# Patient Record
Sex: Male | Born: 1965
Health system: Southern US, Community
[De-identification: ages and names within clinical notes are randomized; demographics above are authoritative.]

## PROBLEM LIST (undated history)

## (undated) DIAGNOSIS — I10 Essential (primary) hypertension: Secondary | ICD-10-CM

## (undated) DIAGNOSIS — C801 Malignant (primary) neoplasm, unspecified: Secondary | ICD-10-CM

## (undated) DIAGNOSIS — T7840XA Allergy, unspecified, initial encounter: Secondary | ICD-10-CM

## (undated) DIAGNOSIS — M7500 Adhesive capsulitis of unspecified shoulder: Secondary | ICD-10-CM

## (undated) DIAGNOSIS — E119 Type 2 diabetes mellitus without complications: Secondary | ICD-10-CM

## (undated) HISTORY — DX: Malignant (primary) neoplasm, unspecified: C80.1

## (undated) HISTORY — DX: Type 2 diabetes mellitus without complications: E11.9

## (undated) HISTORY — DX: Essential (primary) hypertension: I10

## (undated) HISTORY — DX: Allergy, unspecified, initial encounter: T78.40XA

## (undated) HISTORY — DX: Adhesive capsulitis of unspecified shoulder: M75.00

---

## 2011-05-07 DIAGNOSIS — G4700 Insomnia, unspecified: Secondary | ICD-10-CM | POA: Insufficient documentation

## 2011-05-07 DIAGNOSIS — Z720 Tobacco use: Secondary | ICD-10-CM | POA: Insufficient documentation

## 2011-05-07 DIAGNOSIS — E119 Type 2 diabetes mellitus without complications: Secondary | ICD-10-CM | POA: Insufficient documentation

## 2011-08-06 DIAGNOSIS — E785 Hyperlipidemia, unspecified: Secondary | ICD-10-CM | POA: Insufficient documentation

## 2011-10-08 DIAGNOSIS — N529 Male erectile dysfunction, unspecified: Secondary | ICD-10-CM | POA: Insufficient documentation

## 2011-12-09 DIAGNOSIS — C4491 Basal cell carcinoma of skin, unspecified: Secondary | ICD-10-CM | POA: Insufficient documentation

## 2012-01-20 DIAGNOSIS — Z483 Aftercare following surgery for neoplasm: Secondary | ICD-10-CM | POA: Insufficient documentation

## 2014-02-10 DIAGNOSIS — S43439A Superior glenoid labrum lesion of unspecified shoulder, initial encounter: Secondary | ICD-10-CM | POA: Insufficient documentation

## 2014-03-16 DIAGNOSIS — M5136 Other intervertebral disc degeneration, lumbar region: Secondary | ICD-10-CM | POA: Insufficient documentation

## 2014-03-16 DIAGNOSIS — M545 Low back pain, unspecified: Secondary | ICD-10-CM | POA: Insufficient documentation

## 2014-03-16 DIAGNOSIS — M5416 Radiculopathy, lumbar region: Secondary | ICD-10-CM | POA: Insufficient documentation

## 2014-03-16 DIAGNOSIS — M51369 Other intervertebral disc degeneration, lumbar region without mention of lumbar back pain or lower extremity pain: Secondary | ICD-10-CM | POA: Insufficient documentation

## 2017-06-26 DIAGNOSIS — M7502 Adhesive capsulitis of left shoulder: Secondary | ICD-10-CM | POA: Insufficient documentation

## 2018-05-18 ENCOUNTER — Ambulatory Visit (INDEPENDENT_AMBULATORY_CARE_PROVIDER_SITE_OTHER): Payer: BLUE CROSS/BLUE SHIELD | Admitting: Family Medicine

## 2018-05-18 ENCOUNTER — Other Ambulatory Visit: Payer: Self-pay

## 2018-05-18 ENCOUNTER — Encounter: Payer: Self-pay | Admitting: Family Medicine

## 2018-05-18 VITALS — BP 102/60 | HR 84 | Ht 72.0 in | Wt 206.0 lb

## 2018-05-18 DIAGNOSIS — E119 Type 2 diabetes mellitus without complications: Secondary | ICD-10-CM

## 2018-05-18 DIAGNOSIS — Z7689 Persons encountering health services in other specified circumstances: Secondary | ICD-10-CM | POA: Diagnosis not present

## 2018-05-18 DIAGNOSIS — F17210 Nicotine dependence, cigarettes, uncomplicated: Secondary | ICD-10-CM

## 2018-05-18 DIAGNOSIS — D229 Melanocytic nevi, unspecified: Secondary | ICD-10-CM | POA: Diagnosis not present

## 2018-05-18 DIAGNOSIS — I10 Essential (primary) hypertension: Secondary | ICD-10-CM

## 2018-05-18 DIAGNOSIS — E785 Hyperlipidemia, unspecified: Secondary | ICD-10-CM | POA: Diagnosis not present

## 2018-05-18 MED ORDER — METFORMIN HCL 1000 MG PO TABS: 1000.0000 mg | ORAL_TABLET | Freq: Two times a day (BID) | ORAL | 1 refills | Status: AC

## 2018-05-18 MED ORDER — LISINOPRIL 10 MG PO TABS: 10.0000 mg | ORAL_TABLET | Freq: Every day | ORAL | 1 refills | Status: DC

## 2018-05-18 MED ORDER — GLIMEPIRIDE 1 MG PO TABS: 1.0000 mg | ORAL_TABLET | Freq: Every day | ORAL | 1 refills | Status: DC

## 2018-05-18 NOTE — Patient Instructions (Signed)
Coping with Quitting Smoking  Quitting smoking is a physical and mental challenge. You will face cravings, withdrawal symptoms, and temptation. Before quitting, work with your health care provider to make a plan that can help you cope. Preparation can help you quit and keep you from giving in. How can I cope with cravings? Cravings usually last for 5-10 minutes. If you get through it, the craving will pass. Consider taking the following actions to help you cope with cravings:  Keep your mouth busy: ? Chew sugar-free gum. ? Suck on hard candies or a straw. ? Brush your teeth.  Keep your hands and body busy: ? Immediately change to a different activity when you feel a craving. ? Squeeze or play with a ball. ? Do an activity or a hobby, like making bead jewelry, practicing needlepoint, or working with wood. ? Mix up your normal routine. ? Take a short exercise break. Go for a quick walk or run up and down stairs. ? Spend time in public places where smoking is not allowed.  Focus on doing something kind or helpful for someone else.  Call a friend or family member to talk during a craving.  Join a support group.  Call a quit line, such as 1-800-QUIT-NOW.  Talk with your health care provider about medicines that might help you cope with cravings and make quitting easier for you. How can I deal with withdrawal symptoms? Your body may experience negative effects as it tries to get used to not having nicotine in the system. These effects are called withdrawal symptoms. They may include:  Feeling hungrier than normal.  Trouble concentrating.  Irritability.  Trouble sleeping.  Feeling depressed.  Restlessness and agitation.  Craving a cigarette. To manage withdrawal symptoms:  Avoid places, people, and activities that trigger your cravings.  Remember why you want to quit.  Get plenty of sleep.  Avoid coffee and other caffeinated drinks. These may worsen some of your symptoms.  How can I handle social situations? Social situations can be difficult when you are quitting smoking, especially in the first few weeks. To manage this, you can:  Avoid parties, bars, and other social situations where people might be smoking.  Avoid alcohol.  Leave right away if you have the urge to smoke.  Explain to your family and friends that you are quitting smoking. Ask for understanding and support.  Plan activities with friends or family where smoking is not an option. What are some ways I can cope with stress? Wanting to smoke may cause stress, and stress can make you want to smoke. Find ways to manage your stress. Relaxation techniques can help. For example:  Breathe slowly and deeply, in through your nose and out through your mouth.  Listen to soothing, relaxing music.  Talk with a family member or friend about your stress.  Light a candle.  Soak in a bath or take a shower.  Think about a peaceful place. What are some ways I can prevent weight gain? Be aware that many people gain weight after they quit smoking. However, not everyone does. To keep from gaining weight, have a plan in place before you quit and stick to the plan after you quit. Your plan should include:  Having healthy snacks. When you have a craving, it may help to: ? Eat plain popcorn, crunchy carrots, celery, or other cut vegetables. ? Chew sugar-free gum.  Changing how you eat: ? Eat small portion sizes at meals. ? Eat 4-6 small meals   throughout the day instead of 1-2 large meals a day. ? Be mindful when you eat. Do not watch television or do other things that might distract you as you eat.  Exercising regularly: ? Make time to exercise each day. If you do not have time for a long workout, do short bouts of exercise for 5-10 minutes several times a day. ? Do some form of strengthening exercise, like weight lifting, and some form of aerobic exercise, like running or swimming.  Drinking plenty of  water or other low-calorie or no-calorie drinks. Drink 6-8 glasses of water daily, or as much as instructed by your health care provider. Summary  Quitting smoking is a physical and mental challenge. You will face cravings, withdrawal symptoms, and temptation to smoke again. Preparation can help you as you go through these challenges.  You can cope with cravings by keeping your mouth busy (such as by chewing gum), keeping your body and hands busy, and making calls to family, friends, or a helpline for people who want to quit smoking.  You can cope with withdrawal symptoms by avoiding places where people smoke, avoiding drinks with caffeine, and getting plenty of rest.  Ask your health care provider about the different ways to prevent weight gain, avoid stress, and handle social situations. This information is not intended to replace advice given to you by your health care provider. Make sure you discuss any questions you have with your health care provider. Document Released: 02/02/2016 Document Revised: 02/02/2016 Document Reviewed: 02/02/2016 Elsevier Interactive Patient Education  2019 [employer]  

## 2018-05-18 NOTE — Progress Notes (Signed)
Date:  05/18/2018   Name:  Jacob Coleman   DOB:  12-30-1965   MRN:  [id number]   Chief Complaint: Establish Care; Diabetes (needs A1C and micro); and pneum 23  Patient is a 53 year old male who presents for an establish care exam. The patient reports the following problems: diabetes,htn,dyslipidemia. Health maintenance has been reviewed pneumococcal 23.  Diabetes  He presents for his follow-up diabetic visit. He has type 2 diabetes mellitus. His disease course has been stable. There are no hypoglycemic associated symptoms. Pertinent negatives for hypoglycemia include no dizziness, headaches, nervousness/anxiousness or sweats. Associated symptoms include weight loss. Pertinent negatives for diabetes include no blurred vision, no chest pain, no fatigue, no foot paresthesias, no foot ulcerations, no polydipsia, no polyphagia, no polyuria, no visual change and no weakness. There are no hypoglycemic complications. Symptoms are stable. There are no diabetic complications. Pertinent negatives for diabetic complications include no autonomic neuropathy, CVA, heart disease, peripheral neuropathy, PVD or retinopathy. Risk factors for coronary artery disease include diabetes mellitus, dyslipidemia, male sex, hypertension and tobacco exposure. Current diabetic treatment includes oral agent (dual therapy). He is compliant with treatment all of the time. His weight is decreasing steadily. He is following a generally unhealthy diet. Meal planning includes avoidance of concentrated sweets and carbohydrate counting. He participates in exercise daily. His breakfast blood glucose is taken between 7-8 am. His breakfast blood glucose range is generally 110-130 mg/dl. An ACE inhibitor/angiotensin II receptor blocker is being taken. He does not see a podiatrist.Eye exam is current.  Hyperlipidemia  This is a chronic problem. The current episode started more than 1 year ago. The problem is controlled. Recent lipid tests  were reviewed and are normal. He has no history of chronic renal disease, diabetes, hypothyroidism, liver disease, obesity or nephrotic syndrome. There are no known factors aggravating his hyperlipidemia. Pertinent negatives include no chest pain, focal sensory loss, focal weakness, leg pain, myalgias or shortness of breath. He is currently on no antihyperlipidemic treatment. The current treatment provides no improvement of lipids. There are no compliance problems.  Risk factors for coronary artery disease include dyslipidemia, diabetes mellitus, hypertension and male sex.  Hypertension  This is a chronic problem. The current episode started more than 1 year ago. The problem has been waxing and waning since onset. Pertinent negatives include no anxiety, blurred vision, chest pain, headaches, malaise/fatigue, neck pain, orthopnea, palpitations, peripheral edema, PND, shortness of breath or sweats. There are no associated agents to hypertension. There are no known risk factors for coronary artery disease. Past treatments include ACE inhibitors. The current treatment provides moderate improvement. There are no compliance problems.  There is no history of angina, CAD/MI, CVA, heart failure, left ventricular hypertrophy, PVD or retinopathy. There is no history of chronic renal disease, a hypertension causing med or renovascular disease.  Shoulder Pain   The pain is present in the left shoulder. This is a recurrent problem. The current episode started more than 1 month ago. The problem occurs daily. The problem has been gradually improving. The pain is moderate. Pertinent negatives include no fever. He has tried nothing (90% improved) for the symptoms. There is no history of diabetes.    Review of Systems  Constitutional: Positive for weight loss. Negative for chills, fatigue, fever and malaise/fatigue.  HENT: Negative for drooling, ear discharge, ear pain and sore throat.   Eyes: Negative for blurred vision.   Respiratory: Negative for cough, shortness of breath and wheezing.   Cardiovascular:  Negative for chest pain, palpitations, orthopnea, leg swelling and PND.  Gastrointestinal: Negative for abdominal pain, blood in stool, constipation, diarrhea and nausea.  Endocrine: Negative for polydipsia, polyphagia and polyuria.  Genitourinary: Negative for dysuria, frequency, hematuria and urgency.  Musculoskeletal: Negative for back pain, myalgias and neck pain.  Skin: Negative for rash.  Allergic/Immunologic: Negative for environmental allergies.  Neurological: Negative for dizziness, focal weakness, weakness and headaches.  Hematological: Does not bruise/bleed easily.  Psychiatric/Behavioral: Negative for suicidal ideas. The patient is not nervous/anxious.     Patient Active Problem List   Diagnosis Date Noted  . Adhesive capsulitis of left shoulder 06/26/2017  . DDD (degenerative disc disease), lumbar 03/16/2014  . Low back pain 03/16/2014  . Lumbar radicular pain 03/16/2014  . Superior glenoid labrum lesion 02/10/2014  . Aftercare following surgery for neoplasm 01/20/2012  . Basal cell carcinoma 12/09/2011  . ED (erectile dysfunction) 10/08/2011  . Dyslipidemia 08/06/2011  . Diabetes mellitus without complication ([place]) 41/74/0814  . Insomnia 05/07/2011  . Tobacco use 05/07/2011    No Known Allergies  History reviewed. No pertinent surgical history.  Social History   Tobacco Use  . Smoking status: Current Every Day Smoker    Types: Cigarettes, E-cigarettes  . Smokeless tobacco: Never Used  . Tobacco comment: wants info on quiting  Substance Use Topics  . Alcohol use: Yes  . Drug use: Never     Medication list has been reviewed and updated.  Current Meds  Medication Sig  . aspirin EC 81 MG tablet Take 1 tablet by mouth daily.  Marland Kitchen lisinopril (PRINIVIL,ZESTRIL) 10 MG tablet Take 10 mg by mouth daily.  . metFORMIN (GLUCOPHAGE) 1000 MG tablet Take 1 tablet by mouth 2 (two)  times daily.    PHQ 2/9 Scores 05/18/2018  PHQ - 2 Score 0  PHQ- 9 Score 0    BP Readings from Last 3 Encounters:  05/18/18 102/60    Physical Exam Vitals signs and nursing note reviewed.  Constitutional:      Appearance: He is normal weight.  HENT:     Head: Normocephalic.     Right Ear: External ear normal.     Left Ear: External ear normal.     Nose: Nose normal.  Eyes:     General: No scleral icterus.       Right eye: No discharge.        Left eye: No discharge.     Conjunctiva/sclera: Conjunctivae normal.     Pupils: Pupils are equal, round, and reactive to light.  Neck:     Musculoskeletal: Normal range of motion and neck supple.     Thyroid: No thyromegaly.     Vascular: No JVD.     Trachea: No tracheal deviation.  Cardiovascular:     Rate and Rhythm: Normal rate and regular rhythm.     Heart sounds: Normal heart sounds. No murmur. No friction rub. No gallop.   Pulmonary:     Effort: No respiratory distress.     Breath sounds: Normal breath sounds. No wheezing or rales.  Abdominal:     General: Bowel sounds are normal.     Palpations: Abdomen is soft. There is no mass.     Tenderness: There is no abdominal tenderness. There is no guarding or rebound.  Musculoskeletal: Normal range of motion.        General: No tenderness.  Lymphadenopathy:     Cervical: No cervical adenopathy.  Skin:    General: Skin is warm.  Findings: No rash.     Comments: Multiple nevi  Neurological:     Mental Status: He is alert and oriented to person, place, and time.     Cranial Nerves: No cranial nerve deficit.     Deep Tendon Reflexes: Reflexes are normal and symmetric.     Wt Readings from Last 3 Encounters:  05/18/18 206 lb (93.4 kg)    BP 102/60   Pulse 84   Ht 6' (1.829 m)   Wt 206 lb (93.4 kg)   BMI 27.94 kg/m   Assessment and Plan: 1. Establishing care with new doctor, encounter for Patient establishes care with new physician.  Patient's chart was reviewed  for previous visits, labs, imaging, and specialty notes.  2. Type 2 diabetes mellitus without complication, without long-term current use of insulin ([place]) Patient has longstanding diabetes for which he has had intermittent controlled.  Presently he is not taking neither his Amaryl or metformin on a regular basis.  Will resume both of these on previous dosing schedule and will check an A1c at this time to see the range in which he is - glimepiride (AMARYL) 1 MG tablet; Take 1 tablet (1 mg total) by mouth daily.  Dispense: 90 tablet; Refill: 1 - metFORMIN (GLUCOPHAGE) 1000 MG tablet; Take 1 tablet (1,000 mg total) by mouth 2 (two) times daily.  Dispense: 180 tablet; Refill: 1 - Hemoglobin A1c  3. Essential hypertension Patient has a history of hypertension.  We will continue lisinopril 10 mg once a day and check a renal function panel - lisinopril (PRINIVIL,ZESTRIL) 10 MG tablet; Take 1 tablet (10 mg total) by mouth daily.  Dispense: 90 tablet; Refill: 1 - Renal Function Panel  4. Multiple pigmented nevi Patient has had dermatology mole checks on a regular basis until most recently.  Patient will be referred to Dr. Phillip Heal dermatology for evaluation of multiple pigmented nevi - Ambulatory referral to Dermatology  5. Cigarette nicotine dependence without complication Patient has a history of nicotine dependence in the past on vaping/smoking.  Information was given on cessation and will discuss location if needed in next visit  6. Dyslipidemia Patient has history of dyslipidemia.  Will check a lipid panel and adjust accordingly. - Lipid panel

## 2018-05-19 LAB — RENAL FUNCTION PANEL
Albumin: 4.8 g/dL (ref 3.8–4.9)
BUN/Creatinine Ratio: 20 (ref 9–20)
BUN: 16 mg/dL (ref 6–24)
CO2: 21 mmol/L (ref 20–29)
Calcium: 10 mg/dL (ref 8.7–10.2)
Chloride: 98 mmol/L (ref 96–106)
Creatinine, Ser: 0.82 mg/dL (ref 0.76–1.27)
GFR calc Af Amer: 117 mL/min/{1.73_m2} (ref >59)
GFR calc non Af Amer: 101 mL/min/{1.73_m2} (ref >59)
Glucose: 295 mg/dL — ABNORMAL HIGH (ref 65–99)
Phosphorus: 3.2 mg/dL (ref 2.8–4.1)
Potassium: 4.8 mmol/L (ref 3.5–5.2)
Sodium: 137 mmol/L (ref 134–144)

## 2018-05-19 LAB — LIPID PANEL
Chol/HDL Ratio: 5.8 ratio — ABNORMAL HIGH (ref 0.0–5.0)
Cholesterol, Total: 233 mg/dL — ABNORMAL HIGH (ref 100–199)
HDL: 40 mg/dL (ref >39)
LDL Calculated: 161 mg/dL — ABNORMAL HIGH (ref 0–99)
Triglycerides: 158 mg/dL — ABNORMAL HIGH (ref 0–149)
VLDL Cholesterol Cal: 32 mg/dL (ref 5–40)

## 2018-05-19 LAB — HEMOGLOBIN A1C
Est. average glucose Bld gHb Est-mCnc: 246 mg/dL
Hgb A1c MFr Bld: 10.2 % — ABNORMAL HIGH (ref 4.8–5.6)

## 2018-05-25 DIAGNOSIS — Z85828 Personal history of other malignant neoplasm of skin: Secondary | ICD-10-CM | POA: Diagnosis not present

## 2018-05-25 DIAGNOSIS — L570 Actinic keratosis: Secondary | ICD-10-CM | POA: Diagnosis not present

## 2018-05-25 DIAGNOSIS — L578 Other skin changes due to chronic exposure to nonionizing radiation: Secondary | ICD-10-CM | POA: Diagnosis not present

## 2018-05-25 DIAGNOSIS — D485 Neoplasm of uncertain behavior of skin: Secondary | ICD-10-CM | POA: Diagnosis not present

## 2018-06-29 ENCOUNTER — Other Ambulatory Visit: Payer: Self-pay

## 2018-06-29 ENCOUNTER — Ambulatory Visit (INDEPENDENT_AMBULATORY_CARE_PROVIDER_SITE_OTHER): Payer: BLUE CROSS/BLUE SHIELD | Admitting: Family Medicine

## 2018-06-29 ENCOUNTER — Encounter: Payer: Self-pay | Admitting: Family Medicine

## 2018-06-29 VITALS — BP 130/62 | HR 72 | Ht 72.0 in | Wt 212.0 lb

## 2018-06-29 DIAGNOSIS — Z23 Encounter for immunization: Secondary | ICD-10-CM

## 2018-06-29 DIAGNOSIS — E119 Type 2 diabetes mellitus without complications: Secondary | ICD-10-CM

## 2018-06-29 DIAGNOSIS — I10 Essential (primary) hypertension: Secondary | ICD-10-CM | POA: Diagnosis not present

## 2018-06-29 DIAGNOSIS — E785 Hyperlipidemia, unspecified: Secondary | ICD-10-CM | POA: Diagnosis not present

## 2018-06-29 DIAGNOSIS — E663 Overweight: Secondary | ICD-10-CM | POA: Diagnosis not present

## 2018-06-29 MED ORDER — ATORVASTATIN CALCIUM 10 MG PO TABS: 10.0000 mg | ORAL_TABLET | Freq: Every day | ORAL | 1 refills | Status: DC

## 2018-06-29 MED ORDER — GLIMEPIRIDE 2 MG PO TABS: 2.0000 mg | ORAL_TABLET | Freq: Every day | ORAL | 1 refills | Status: AC

## 2018-06-29 NOTE — Progress Notes (Signed)
Date:  06/29/2018   Name:  Jacob Coleman   DOB:  11/01/1965   MRN:  [id number]   Chief Complaint: Diabetes (has been back on Amaryl and metformin- last A1c 10.2/ need foot exam) and pneum vacc (needs 23 vaccine)  Diabetes  He presents for his follow-up diabetic visit. He has type 2 diabetes mellitus. His disease course has been stable. There are no hypoglycemic associated symptoms. Pertinent negatives for hypoglycemia include no dizziness, headaches or nervousness/anxiousness. Pertinent negatives for diabetes include no blurred vision, no chest pain, no fatigue, no foot paresthesias, no foot ulcerations, no polydipsia, no polyphagia, no polyuria, no visual change, no weakness and no weight loss. There are no hypoglycemic complications. Symptoms are stable. There are no diabetic complications. Risk factors for coronary artery disease include diabetes mellitus, dyslipidemia, hypertension and male sex. He is compliant with treatment most of the time. His weight is stable. He is following a generally healthy diet. Meal planning includes avoidance of concentrated sweets. He participates in exercise intermittently. There is no change (200 range) in his home blood glucose trend. His breakfast blood glucose is taken between 8-9 am. His breakfast blood glucose range is generally 180-200 mg/dl. An ACE inhibitor/angiotensin II receptor blocker is being taken. He does not see a podiatrist.Eye exam is current.  Hyperlipidemia  This is a chronic problem. The current episode started more than 1 year ago. The problem is controlled. Recent lipid tests were reviewed and are normal. There are no known factors aggravating his hyperlipidemia. Pertinent negatives include no chest pain, focal sensory loss, focal weakness, leg pain, myalgias or shortness of breath. Current antihyperlipidemic treatment includes diet change. The current treatment provides moderate improvement of lipids. Compliance problems include adherence  to diet.  Risk factors for coronary artery disease include dyslipidemia, diabetes mellitus, hypertension and male sex.  Hypertension  This is a chronic problem. The current episode started more than 1 year ago. The problem is unchanged. Pertinent negatives include no blurred vision, chest pain, headaches, neck pain, orthopnea, palpitations, PND or shortness of breath. Risk factors for coronary artery disease include dyslipidemia. Past treatments include ACE inhibitors. The current treatment provides moderate improvement. There are no compliance problems.     Review of Systems  Constitutional: Negative for chills, fatigue, fever and weight loss.  HENT: Negative for drooling, ear discharge, ear pain and sore throat.   Eyes: Negative for blurred vision.  Respiratory: Negative for cough, shortness of breath and wheezing.   Cardiovascular: Negative for chest pain, palpitations, orthopnea, leg swelling and PND.  Gastrointestinal: Negative for abdominal pain, blood in stool, constipation, diarrhea and nausea.  Endocrine: Negative for polydipsia, polyphagia and polyuria.  Genitourinary: Negative for dysuria, frequency, hematuria and urgency.  Musculoskeletal: Negative for back pain, myalgias and neck pain.  Skin: Negative for rash.  Allergic/Immunologic: Negative for environmental allergies.  Neurological: Negative for dizziness, focal weakness, weakness and headaches.  Hematological: Does not bruise/bleed easily.  Psychiatric/Behavioral: Negative for suicidal ideas. The patient is not nervous/anxious.     Patient Active Problem List   Diagnosis Date Noted  . Adhesive capsulitis of left shoulder 06/26/2017  . DDD (degenerative disc disease), lumbar 03/16/2014  . Low back pain 03/16/2014  . Lumbar radicular pain 03/16/2014  . Superior glenoid labrum lesion 02/10/2014  . Aftercare following surgery for neoplasm 01/20/2012  . Basal cell carcinoma 12/09/2011  . ED (erectile dysfunction) 10/08/2011   . Dyslipidemia 08/06/2011  . Diabetes mellitus without complication ([place]) 02/72/5366  . Insomnia  05/07/2011  . Tobacco use 05/07/2011    No Known Allergies  No past surgical history on file.  Social History   Tobacco Use  . Smoking status: Current Every Day Smoker    Types: Cigarettes, E-cigarettes  . Smokeless tobacco: Never Used  . Tobacco comment: wants info on quiting  Substance Use Topics  . Alcohol use: Yes  . Drug use: Never     Medication list has been reviewed and updated.  Current Meds  Medication Sig  . aspirin EC 81 MG tablet Take 1 tablet by mouth daily.  Marland Kitchen glimepiride (AMARYL) 1 MG tablet Take 1 tablet (1 mg total) by mouth daily.  Marland Kitchen lisinopril (PRINIVIL,ZESTRIL) 10 MG tablet Take 1 tablet (10 mg total) by mouth daily.  . metFORMIN (GLUCOPHAGE) 1000 MG tablet Take 1 tablet (1,000 mg total) by mouth 2 (two) times daily.    PHQ 2/9 Scores 05/18/2018  PHQ - 2 Score 0  PHQ- 9 Score 0    BP Readings from Last 3 Encounters:  06/29/18 130/62  05/18/18 102/60    Physical Exam Vitals signs and nursing note reviewed.  HENT:     Head: Normocephalic.     Right Ear: External ear normal.     Left Ear: External ear normal.     Nose: Nose normal.  Eyes:     General: No scleral icterus.       Right eye: No discharge.        Left eye: No discharge.     Conjunctiva/sclera: Conjunctivae normal.     Pupils: Pupils are equal, round, and reactive to light.  Neck:     Musculoskeletal: Normal range of motion and neck supple.     Thyroid: No thyromegaly.     Vascular: No JVD.     Trachea: No tracheal deviation.  Cardiovascular:     Rate and Rhythm: Normal rate and regular rhythm.     Pulses:          Carotid pulses are 2+ on the right side and 2+ on the left side.      Radial pulses are 2+ on the right side and 2+ on the left side.       Dorsalis pedis pulses are 2+ on the right side and 2+ on the left side.       Posterior tibial pulses are 2+ on the right  side and 2+ on the left side.     Heart sounds: Normal heart sounds. No murmur. No friction rub. No gallop.   Pulmonary:     Effort: No respiratory distress.     Breath sounds: Normal breath sounds. No wheezing or rales.  Abdominal:     General: Bowel sounds are normal.     Palpations: Abdomen is soft. There is no mass.     Tenderness: There is no abdominal tenderness. There is no guarding or rebound.  Musculoskeletal: Normal range of motion.        General: No tenderness.     Right lower leg: No edema.     Left lower leg: No edema.  Feet:     Right foot:     Protective Sensation: 10 sites tested. 10 sites sensed.     Skin integrity: Skin integrity normal.     Left foot:     Protective Sensation: 10 sites tested. 10 sites sensed.     Skin integrity: Skin integrity normal.  Lymphadenopathy:     Cervical: No cervical adenopathy.  Skin:  General: Skin is warm.     Findings: No rash.  Neurological:     Mental Status: He is alert and oriented to person, place, and time.     Cranial Nerves: No cranial nerve deficit.     Deep Tendon Reflexes: Reflexes are normal and symmetric.     Wt Readings from Last 3 Encounters:  06/29/18 212 lb (96.2 kg)  05/18/18 206 lb (93.4 kg)    BP 130/62   Pulse 72   Ht 6' (1.829 m)   Wt 212 lb (96.2 kg)   BMI 28.75 kg/m   Assessment and Plan: 1. Diabetes mellitus without complication ([facility]) Chronic.  Uncontrolled.  Patient's had elevated readings on Amaryl 1 mg.  Will check an A1c/glucose and will probably need to go to glimepiride 2 mg 1 a day to be rechecked in 6 weeks this may change to 1 mg pending A1c reading. - Hemoglobin A1c - Glucose - glimepiride (AMARYL) 2 MG tablet; Take 1 tablet (2 mg total) by mouth daily before breakfast.  Dispense: 60 tablet; Refill: 1  2. Dyslipidemia Chronic.  Unknown.  Previous readings have been elevated for LDL as well as VLDL triglycerides and HDL has been decreased.  I am anticipating having to initiate  a statin and we will start with atorvastatin 10 mg patient was given a written prescription to hold until I return a call with a LDL reading from today that is fasting. - Direct LDL - atorvastatin (LIPITOR) 10 MG tablet; Take 1 tablet (10 mg total) by mouth daily.  Dispense: 90 tablet; Refill: 1  3. Essential hypertension Patient has hypertension which is controlled.  And chronic in nature.  We will continue on lisinopril 10 mg once a day.  4. Overweight (BMI 25.0-29.9) Health risks of being over weight were discussed and patient was counseled on weight loss options and exercise.  Low-cholesterol sheets were given as guidelines for weight loss as well  5. Need for Tdap vaccination Discussed and administered. - Tdap vaccine greater than or equal to 7yo IM

## 2018-06-29 NOTE — Patient Instructions (Signed)

## 2018-06-30 LAB — GLUCOSE, RANDOM: Glucose: 192 mg/dL — ABNORMAL HIGH (ref 65–99)

## 2018-06-30 LAB — HEMOGLOBIN A1C
Est. average glucose Bld gHb Est-mCnc: 217 mg/dL
Hgb A1c MFr Bld: 9.2 % — ABNORMAL HIGH (ref 4.8–5.6)

## 2018-06-30 LAB — LDL CHOLESTEROL, DIRECT: LDL Direct: 102 mg/dL — ABNORMAL HIGH (ref 0–99)

## 2018-07-02 ENCOUNTER — Other Ambulatory Visit: Payer: Self-pay

## 2018-07-02 DIAGNOSIS — E119 Type 2 diabetes mellitus without complications: Secondary | ICD-10-CM

## 2018-07-02 NOTE — Progress Notes (Unsigned)
Ref endo

## 2018-08-03 DIAGNOSIS — E1169 Type 2 diabetes mellitus with other specified complication: Secondary | ICD-10-CM | POA: Diagnosis not present

## 2018-08-03 DIAGNOSIS — E1165 Type 2 diabetes mellitus with hyperglycemia: Secondary | ICD-10-CM | POA: Diagnosis not present

## 2018-08-03 DIAGNOSIS — E1159 Type 2 diabetes mellitus with other circulatory complications: Secondary | ICD-10-CM | POA: Diagnosis not present

## 2018-08-03 DIAGNOSIS — F17200 Nicotine dependence, unspecified, uncomplicated: Secondary | ICD-10-CM | POA: Diagnosis not present

## 2018-08-19 ENCOUNTER — Encounter: Payer: Self-pay | Admitting: Family Medicine

## 2018-08-19 ENCOUNTER — Ambulatory Visit (INDEPENDENT_AMBULATORY_CARE_PROVIDER_SITE_OTHER): Payer: BC Managed Care – PPO | Admitting: Family Medicine

## 2018-08-19 ENCOUNTER — Other Ambulatory Visit: Payer: Self-pay

## 2018-08-19 VITALS — BP 138/78 | HR 80 | Ht 72.0 in | Wt 208.0 lb

## 2018-08-19 DIAGNOSIS — R69 Illness, unspecified: Secondary | ICD-10-CM

## 2018-08-19 DIAGNOSIS — E785 Hyperlipidemia, unspecified: Secondary | ICD-10-CM

## 2018-08-19 DIAGNOSIS — I10 Essential (primary) hypertension: Secondary | ICD-10-CM | POA: Diagnosis not present

## 2018-08-19 DIAGNOSIS — Z23 Encounter for immunization: Secondary | ICD-10-CM | POA: Diagnosis not present

## 2018-08-19 MED ORDER — LISINOPRIL 10 MG PO TABS: 10.0000 mg | ORAL_TABLET | Freq: Every day | ORAL | 1 refills | Status: DC

## 2018-08-19 MED ORDER — ATORVASTATIN CALCIUM 10 MG PO TABS: 10.0000 mg | ORAL_TABLET | Freq: Every day | ORAL | 1 refills | Status: DC

## 2018-08-19 NOTE — Progress Notes (Signed)
Date:  08/19/2018   Name:  Jacob Coleman   DOB:  Jul 19, 1965   MRN:  [id number]   Chief Complaint: Hypertension and Hyperlipidemia (chol elevated x 3 months ago)  Hypertension This is a chronic problem. The current episode started more than 1 year ago. The problem is unchanged. The problem is controlled. Pertinent negatives include no anxiety, blurred vision, chest pain, headaches, malaise/fatigue, neck pain, orthopnea, palpitations, peripheral edema, PND, shortness of breath or sweats. There are no associated agents to hypertension. Risk factors for coronary artery disease include dyslipidemia. Past treatments include ACE inhibitors. The current treatment provides moderate improvement. There are no compliance problems.  There is no history of angina, kidney disease, CAD/MI, CVA, heart failure, left ventricular hypertrophy, PVD or retinopathy. There is no history of chronic renal disease, a hypertension causing med or renovascular disease.  Hyperlipidemia This is a chronic problem. The current episode started more than 1 year ago. The problem is controlled. Recent lipid tests were reviewed and are normal. Exacerbating diseases include diabetes. He has no history of chronic renal disease, hypothyroidism, liver disease, obesity or nephrotic syndrome. Pertinent negatives include no chest pain, myalgias or shortness of breath.    Review of Systems  Constitutional: Negative for chills, fever and malaise/fatigue.  HENT: Negative for drooling, ear discharge, ear pain and sore throat.   Eyes: Negative for blurred vision.  Respiratory: Negative for cough, shortness of breath and wheezing.   Cardiovascular: Negative for chest pain, palpitations, orthopnea, leg swelling and PND.  Gastrointestinal: Negative for abdominal pain, blood in stool, constipation, diarrhea and nausea.  Endocrine: Negative for polydipsia.  Genitourinary: Negative for dysuria, frequency, hematuria and urgency.  Musculoskeletal:  Negative for back pain, myalgias and neck pain.  Skin: Negative for rash.  Allergic/Immunologic: Negative for environmental allergies.  Neurological: Negative for dizziness and headaches.  Hematological: Does not bruise/bleed easily.  Psychiatric/Behavioral: Negative for suicidal ideas. The patient is not nervous/anxious.     Patient Active Problem List   Diagnosis Date Noted  . Essential hypertension 06/29/2018  . Adhesive capsulitis of left shoulder 06/26/2017  . DDD (degenerative disc disease), lumbar 03/16/2014  . Low back pain 03/16/2014  . Lumbar radicular pain 03/16/2014  . Superior glenoid labrum lesion 02/10/2014  . Aftercare following surgery for neoplasm 01/20/2012  . Basal cell carcinoma 12/09/2011  . ED (erectile dysfunction) 10/08/2011  . Dyslipidemia 08/06/2011  . Diabetes mellitus without complication ([place]) 94/[id number]/9628  . Insomnia 05/07/2011    No Known Allergies  No past surgical history on file.  Social History   Tobacco Use  . Smoking status: Current Every Day Smoker    Types: E-cigarettes  . Smokeless tobacco: Never Used  . Tobacco comment: wants info on quiting  Substance Use Topics  . Alcohol use: Yes  . Drug use: Never     Medication list has been reviewed and updated.  Current Meds  Medication Sig  . aspirin EC 81 MG tablet Take 1 tablet by mouth daily.  Marland Kitchen atorvastatin (LIPITOR) 10 MG tablet Take 1 tablet (10 mg total) by mouth daily.  . empagliflozin (JARDIANCE) 25 MG TABS tablet Take 25 mg by mouth daily. Solum  . glimepiride (AMARYL) 2 MG tablet Take 1 tablet (2 mg total) by mouth daily before breakfast. (Patient taking differently: Take 2 mg by mouth daily before breakfast. solum)  . lisinopril (PRINIVIL,ZESTRIL) 10 MG tablet Take 1 tablet (10 mg total) by mouth daily.  . metFORMIN (GLUCOPHAGE) 1000 MG tablet Take 1  tablet (1,000 mg total) by mouth 2 (two) times daily. (Patient taking differently: Take 1,000 mg by mouth 2 (two) times daily.  solum)    PHQ 2/9 Scores 08/19/2018 05/18/2018  PHQ - 2 Score 0 0  PHQ- 9 Score 0 0    BP Readings from Last 3 Encounters:  08/19/18 138/78  06/29/18 130/62  05/18/18 102/60    Physical Exam Vitals signs and nursing note reviewed.  HENT:     Head: Normocephalic.     Right Ear: Tympanic membrane, ear canal and external ear normal.     Left Ear: Tympanic membrane, ear canal and external ear normal.     Nose: Nose normal.  Eyes:     General: No scleral icterus.       Right eye: No discharge.        Left eye: No discharge.     Conjunctiva/sclera: Conjunctivae normal.     Pupils: Pupils are equal, round, and reactive to light.  Neck:     Musculoskeletal: Normal range of motion and neck supple.     Thyroid: No thyromegaly.     Vascular: No JVD.     Trachea: No tracheal deviation.  Cardiovascular:     Rate and Rhythm: Normal rate and regular rhythm.     Heart sounds: Normal heart sounds. No murmur. No friction rub. No gallop.   Pulmonary:     Effort: No respiratory distress.     Breath sounds: Normal breath sounds. No stridor. No wheezing, rhonchi or rales.  Chest:     Chest wall: No tenderness.  Abdominal:     General: Bowel sounds are normal.     Palpations: Abdomen is soft. There is no mass.     Tenderness: There is no abdominal tenderness. There is no guarding or rebound.  Musculoskeletal: Normal range of motion.        General: No tenderness.     Right foot: Normal range of motion. No deformity, bunion, Charcot foot, foot drop or prominent metatarsal heads.     Left foot: Normal range of motion. No deformity, bunion, Charcot foot, foot drop or prominent metatarsal heads.  Feet:     Right foot:     Protective Sensation: 10 sites tested. 10 sites sensed.     Skin integrity: Skin integrity normal. No ulcer, blister, skin breakdown, erythema, warmth, callus, dry skin or fissure.     Left foot:     Protective Sensation: 10 sites tested. 10 sites sensed.     Skin integrity:  Skin integrity normal. No ulcer, blister, skin breakdown, erythema, warmth, callus, dry skin or fissure.  Lymphadenopathy:     Cervical: No cervical adenopathy.  Skin:    General: Skin is warm.     Findings: No rash.  Neurological:     Mental Status: He is alert and oriented to person, place, and time.     Cranial Nerves: No cranial nerve deficit.     Deep Tendon Reflexes: Reflexes are normal and symmetric.     Wt Readings from Last 3 Encounters:  08/19/18 208 lb (94.3 kg)  06/29/18 212 lb (96.2 kg)  05/18/18 206 lb (93.4 kg)    BP 138/78   Pulse 80   Ht 6' (1.829 m)   Wt 208 lb (94.3 kg)   BMI 28.21 kg/m    Assessment and Plan: 1. Dyslipidemia Chronic.  Controlled.  Continue atorvastatin 10 mg once a day.  Will check lipid panel. - Lipid panel - atorvastatin (LIPITOR)  10 MG tablet; Take 1 tablet (10 mg total) by mouth daily.  Dispense: 90 tablet; Refill: 1  2. Essential hypertension .  Controlled.  Continue lisinopril 10 mg once a day.  Will check renal function panel. - lisinopril (ZESTRIL) 10 MG tablet; Take 1 tablet (10 mg total) by mouth daily.  Dispense: 90 tablet; Refill: 1 - Renal Function Panel  3. Taking medication for chronic disease And is on statin agent and will check hepatic function to evaluate for ototoxicity. - Hepatic Function Panel (6)  4. Need for pneumococcal vaccination Gust and administered. - Pneumococcal polysaccharide vaccine 23-valent greater than or equal to 2yo subcutaneous/IM

## 2018-08-20 LAB — RENAL FUNCTION PANEL
Albumin: 4.8 g/dL (ref 3.8–4.9)
BUN/Creatinine Ratio: 15 (ref 9–20)
BUN: 15 mg/dL (ref 6–24)
CO2: 20 mmol/L (ref 20–29)
Calcium: 9.9 mg/dL (ref 8.7–10.2)
Chloride: 101 mmol/L (ref 96–106)
Creatinine, Ser: 1.03 mg/dL (ref 0.76–1.27)
GFR calc Af Amer: 95 mL/min/{1.73_m2} (ref >59)
GFR calc non Af Amer: 83 mL/min/{1.73_m2} (ref >59)
Glucose: 148 mg/dL — ABNORMAL HIGH (ref 65–99)
Phosphorus: 4.1 mg/dL (ref 2.8–4.1)
Potassium: 4.4 mmol/L (ref 3.5–5.2)
Sodium: 138 mmol/L (ref 134–144)

## 2018-08-20 LAB — LIPID PANEL
Chol/HDL Ratio: 4 ratio (ref 0.0–5.0)
Cholesterol, Total: 147 mg/dL (ref 100–199)
HDL: 37 mg/dL — ABNORMAL LOW (ref >39)
LDL Calculated: 70 mg/dL (ref 0–99)
Triglycerides: 201 mg/dL — ABNORMAL HIGH (ref 0–149)
VLDL Cholesterol Cal: 40 mg/dL (ref 5–40)

## 2018-08-20 LAB — HEPATIC FUNCTION PANEL (6)
ALT: 23 IU/L (ref 0–44)
AST: 11 IU/L (ref 0–40)
Alkaline Phosphatase: 126 IU/L — ABNORMAL HIGH (ref 39–117)
Bilirubin Total: 0.3 mg/dL (ref 0.0–1.2)
Bilirubin, Direct: 0.1 mg/dL (ref 0.00–0.40)

## 2018-11-18 ENCOUNTER — Encounter: Payer: BLUE CROSS/BLUE SHIELD | Admitting: Family Medicine

## 2018-11-26 ENCOUNTER — Other Ambulatory Visit: Payer: Self-pay | Admitting: Family Medicine

## 2018-11-26 DIAGNOSIS — E119 Type 2 diabetes mellitus without complications: Secondary | ICD-10-CM

## 2018-12-09 DIAGNOSIS — Z872 Personal history of diseases of the skin and subcutaneous tissue: Secondary | ICD-10-CM | POA: Diagnosis not present

## 2018-12-09 DIAGNOSIS — L578 Other skin changes due to chronic exposure to nonionizing radiation: Secondary | ICD-10-CM | POA: Diagnosis not present

## 2018-12-09 DIAGNOSIS — Z85828 Personal history of other malignant neoplasm of skin: Secondary | ICD-10-CM | POA: Diagnosis not present

## 2018-12-16 ENCOUNTER — Encounter: Payer: BC Managed Care – PPO | Admitting: Family Medicine

## 2018-12-24 ENCOUNTER — Encounter: Payer: BC Managed Care – PPO | Admitting: Family Medicine

## 2019-01-05 ENCOUNTER — Encounter: Payer: BC Managed Care – PPO | Admitting: Family Medicine

## 2019-01-26 ENCOUNTER — Other Ambulatory Visit: Payer: Self-pay

## 2019-01-26 ENCOUNTER — Encounter: Payer: Self-pay | Admitting: Family Medicine

## 2019-01-26 ENCOUNTER — Ambulatory Visit (INDEPENDENT_AMBULATORY_CARE_PROVIDER_SITE_OTHER): Payer: BC Managed Care – PPO | Admitting: Family Medicine

## 2019-01-26 VITALS — BP 120/82 | HR 88 | Ht 72.0 in | Wt 208.0 lb

## 2019-01-26 DIAGNOSIS — E119 Type 2 diabetes mellitus without complications: Secondary | ICD-10-CM | POA: Diagnosis not present

## 2019-01-26 DIAGNOSIS — Z23 Encounter for immunization: Secondary | ICD-10-CM

## 2019-01-26 DIAGNOSIS — Z0000 Encounter for general adult medical examination without abnormal findings: Secondary | ICD-10-CM

## 2019-01-26 DIAGNOSIS — E785 Hyperlipidemia, unspecified: Secondary | ICD-10-CM

## 2019-01-26 DIAGNOSIS — I10 Essential (primary) hypertension: Secondary | ICD-10-CM

## 2019-01-26 DIAGNOSIS — R351 Nocturia: Secondary | ICD-10-CM | POA: Diagnosis not present

## 2019-01-26 DIAGNOSIS — E663 Overweight: Secondary | ICD-10-CM

## 2019-01-26 DIAGNOSIS — F17210 Nicotine dependence, cigarettes, uncomplicated: Secondary | ICD-10-CM

## 2019-01-26 MED ORDER — LISINOPRIL 10 MG PO TABS: 10.0000 mg | ORAL_TABLET | Freq: Every day | ORAL | 1 refills | Status: DC

## 2019-01-26 MED ORDER — ATORVASTATIN CALCIUM 10 MG PO TABS: 10.0000 mg | ORAL_TABLET | Freq: Every day | ORAL | 1 refills | Status: DC

## 2019-01-26 NOTE — Progress Notes (Signed)
Date:  01/26/2019   Name:  Jacob Coleman   DOB:  Sep 21, 1965   MRN:  [id number]   Chief Complaint: Annual Exam (hasn't had A1c since May= 9.2)  Patient is a 53 year old male who presents for a comprehensive physical exam. The patient reports the following problems: stress. Health maintenance has been reviewed influenza vax.   Lab Results  Component Value Date   CREATININE 1.03 08/19/2018   BUN 15 08/19/2018   NA 138 08/19/2018   K 4.4 08/19/2018   CL 101 08/19/2018   CO2 20 08/19/2018   Lab Results  Component Value Date   CHOL 147 08/19/2018   HDL 37 (L) 08/19/2018   LDLCALC 70 08/19/2018   LDLDIRECT 102 (H) 06/29/2018   TRIG 201 (H) 08/19/2018   CHOLHDL 4.0 08/19/2018   No results found for: TSH Lab Results  Component Value Date   HGBA1C 9.2 (H) 06/29/2018     Review of Systems  Constitutional: Negative for chills and fever.  HENT: Negative for congestion, drooling, ear discharge, ear pain, hearing loss, mouth sores, nosebleeds, postnasal drip, rhinorrhea, sinus pressure, sinus pain and sore throat.   Eyes: Negative for photophobia, pain, redness and itching.  Respiratory: Negative for cough, choking, chest tightness, shortness of breath and wheezing.   Cardiovascular: Negative for chest pain, palpitations and leg swelling.  Gastrointestinal: Negative for abdominal pain, blood in stool, constipation, diarrhea and nausea.  Endocrine: Negative for polydipsia.  Genitourinary: Negative for dysuria, frequency, hematuria, penile pain, penile swelling, scrotal swelling and urgency.       Decrease stream/nocturia  Musculoskeletal: Negative for back pain, myalgias and neck pain.  Skin: Negative for color change, pallor, rash and wound.  Allergic/Immunologic: Negative for environmental allergies.  Neurological: Negative for dizziness, seizures, speech difficulty, numbness and headaches.  Hematological: Negative for adenopathy. Does not bruise/bleed easily.   Psychiatric/Behavioral: Negative for agitation, behavioral problems, confusion and suicidal ideas. The patient is not nervous/anxious.     Patient Active Problem List   Diagnosis Date Noted  . Essential hypertension 06/29/2018  . Adhesive capsulitis of left shoulder 06/26/2017  . DDD (degenerative disc disease), lumbar 03/16/2014  . Low back pain 03/16/2014  . Lumbar radicular pain 03/16/2014  . Superior glenoid labrum lesion 02/10/2014  . Aftercare following surgery for neoplasm 01/20/2012  . Basal cell carcinoma 12/09/2011  . ED (erectile dysfunction) 10/08/2011  . Dyslipidemia 08/06/2011  . Diabetes mellitus without complication ([place]) 123456  . Insomnia 05/07/2011    No Known Allergies  No past surgical history on file.  Social History   Tobacco Use  . Smoking status: Current Every Day Smoker    Types: E-cigarettes  . Smokeless tobacco: Never Used  . Tobacco comment: wants info on quiting  Substance Use Topics  . Alcohol use: Yes  . Drug use: Never     Medication list has been reviewed and updated.  Current Meds  Medication Sig  . aspirin EC 81 MG tablet Take 1 tablet by mouth daily.  Marland Kitchen atorvastatin (LIPITOR) 10 MG tablet Take 1 tablet (10 mg total) by mouth daily.  . empagliflozin (JARDIANCE) 25 MG TABS tablet Take 25 mg by mouth daily. Solum  . glimepiride (AMARYL) 2 MG tablet Take 1 tablet (2 mg total) by mouth daily before breakfast. (Patient taking differently: Take 2 mg by mouth daily before breakfast. solum)  . lisinopril (ZESTRIL) 10 MG tablet Take 1 tablet (10 mg total) by mouth daily.  . metFORMIN (GLUCOPHAGE) 1000  MG tablet Take 1 tablet (1,000 mg total) by mouth 2 (two) times daily. (Patient taking differently: Take 1,000 mg by mouth 2 (two) times daily. solum)    PHQ 2/9 Scores 01/26/2019 08/19/2018 05/18/2018  PHQ - 2 Score 0 0 0  PHQ- 9 Score 0 0 0    BP Readings from Last 3 Encounters:  01/26/19 120/82  08/19/18 138/78  06/29/18 130/62     Physical Exam Vitals signs and nursing note reviewed.  Constitutional:      Appearance: Normal appearance. He is well-developed, well-groomed and normal weight.  HENT:     Head: Normocephalic.     Jaw: There is normal jaw occlusion.     Right Ear: Hearing, tympanic membrane, ear canal and external ear normal.     Left Ear: Hearing, tympanic membrane, ear canal and external ear normal.     Nose: Nose normal.     Mouth/Throat:     Lips: Pink.     Mouth: Mucous membranes are moist.     Dentition: Normal dentition.     Tongue: No lesions.     Palate: No mass.  Eyes:     General: Lids are normal. Vision grossly intact. Gaze aligned appropriately. No scleral icterus.       Right eye: No discharge.        Left eye: No discharge.     Extraocular Movements: Extraocular movements intact.     Conjunctiva/sclera: Conjunctivae normal.     Pupils: Pupils are equal, round, and reactive to light. Pupils are equal.     Funduscopic exam:    Right eye: Red reflex present.        Left eye: Red reflex present. Neck:     Musculoskeletal: Full passive range of motion without pain, normal range of motion and neck supple.     Thyroid: No thyroid mass, thyromegaly or thyroid tenderness.     Vascular: No JVD.     Trachea: No tracheal deviation.  Cardiovascular:     Rate and Rhythm: Normal rate and regular rhythm.     Chest Wall: PMI is not displaced.     Pulses: Normal pulses. No decreased pulses.          Carotid pulses are 2+ on the right side and 2+ on the left side.      Radial pulses are 2+ on the right side and 2+ on the left side.       Femoral pulses are 2+ on the right side and 2+ on the left side.      Popliteal pulses are 2+ on the right side and 2+ on the left side.       Dorsalis pedis pulses are 2+ on the right side and 2+ on the left side.       Posterior tibial pulses are 2+ on the right side and 2+ on the left side.     Heart sounds: Normal heart sounds, S1 normal and S2 normal. Heart  sounds not distant. No murmur. No friction rub. No gallop. No S3 sounds.   Pulmonary:     Effort: Pulmonary effort is normal. No respiratory distress.     Breath sounds: Normal breath sounds. No stridor, decreased air movement or transmitted upper airway sounds. No decreased breath sounds, wheezing, rhonchi or rales.  Chest:     Chest wall: No mass or tenderness.     Breasts: Breasts are symmetrical.        Right: Normal.  Left: Normal.  Abdominal:     General: Bowel sounds are normal.     Palpations: Abdomen is soft. There is no hepatomegaly, splenomegaly or mass.     Tenderness: There is no abdominal tenderness. There is no guarding or rebound.     Hernia: There is no hernia in the left inguinal area or right inguinal area.  Genitourinary:    Pubic Area: No rash.      Penis: Normal.      Scrotum/Testes: Normal.        Right: Mass, tenderness, swelling, testicular hydrocele or varicocele not present. Right testis is descended. Cremasteric reflex is present.         Left: Mass, tenderness, swelling, testicular hydrocele or varicocele not present. Left testis is descended. Cremasteric reflex is present.      Epididymis:     Right: Normal.     Left: Normal.     Prostate: Normal.     Rectum: Normal.  Musculoskeletal: Normal range of motion.        General: No tenderness.     Lumbar back: Normal.     Right lower leg: No edema.     Left lower leg: No edema.  Lymphadenopathy:     Head:     Right side of head: No submental, submandibular or tonsillar adenopathy.     Left side of head: No submental, submandibular or tonsillar adenopathy.     Cervical: No cervical adenopathy.     Right cervical: No superficial, deep or posterior cervical adenopathy.    Left cervical: No superficial, deep or posterior cervical adenopathy.     Upper Body:     Right upper body: No supraclavicular or axillary adenopathy.     Left upper body: No supraclavicular or axillary adenopathy.     Lower Body:  No right inguinal adenopathy. No left inguinal adenopathy.  Skin:    General: Skin is warm.     Capillary Refill: Capillary refill takes less than 2 seconds.     Coloration: Skin is not ashen, jaundiced or pale.     Findings: No bruising, erythema, lesion or rash.  Neurological:     Mental Status: He is alert and oriented to person, place, and time.     Cranial Nerves: Cranial nerves are intact. No cranial nerve deficit.     Sensory: Sensation is intact.     Motor: Motor function is intact.     Deep Tendon Reflexes: Reflexes are normal and symmetric.     Reflex Scores:      Tricep reflexes are 2+ on the right side and 2+ on the left side.      Bicep reflexes are 2+ on the right side and 2+ on the left side.      Brachioradialis reflexes are 2+ on the right side and 2+ on the left side.      Patellar reflexes are 2+ on the right side and 2+ on the left side.      Achilles reflexes are 2+ on the right side and 2+ on the left side. Psychiatric:        Attention and Perception: Attention and perception normal.        Mood and Affect: Mood and affect normal.        Speech: Speech normal.        Behavior: Behavior normal.        Cognition and Memory: Cognition and memory normal.     Wt Readings from Last 3 Encounters:  01/26/19 208 lb (94.3 kg)  08/19/18 208 lb (94.3 kg)  06/29/18 212 lb (96.2 kg)    BP 120/82   Pulse 88   Ht 6' (1.829 m)   Wt 208 lb (94.3 kg)   BMI 28.21 kg/m   Assessment and Plan:  1. Dyslipidemia Chronic.  Controlled.  Stable.  Continue atorvastatin 10 mg once a day. - atorvastatin (LIPITOR) 10 MG tablet; Take 1 tablet (10 mg total) by mouth daily.  Dispense: 90 tablet; Refill: 1  2. Essential hypertension Chronic.  Controlled.  Stable.  Will continue lisinopril 10 mg once a day. - lisinopril (ZESTRIL) 10 MG tablet; Take 1 tablet (10 mg total) by mouth daily.  Dispense: 90 tablet; Refill: 1  3. Annual physical exam No subjective objective concerns  noted during history and physical exam.  Patient's previous encounter most recent labs and imaging were reviewed.Jacob Coleman is a 53 y.o. male who presents today for his Complete Annual Exam. He feels well. He reports exercising . He reports he is sleeping well. Immunizations are reviewed and recommendations provided.   Age appropriate screening tests are discussed. Counseling given for risk factor reduction interventions.  4. Nocturia Patient's had a decrease in strain and some nocturia.  We will check a PSA.  DRE was noted to be normal. - PSA  5. Cigarette nicotine dependence without complication Patient is still vaping and information was given to help to quit nicotine dependence.  6. Overweight (BMI 25.0-29.9) Health risks of being over weight were discussed and patient was counseled on weight loss options and exercise.  7. Influenza vaccine needed Discussed and administered - Flu Vaccine QUAD 6+ mos PF IM (Fluarix Quad PF)

## 2019-01-26 NOTE — Patient Instructions (Signed)
Coping with Quitting Smoking  Quitting smoking is a physical and mental challenge. You will face cravings, withdrawal symptoms, and temptation. Before quitting, work with your health care provider to make a plan that can help you cope. Preparation can help you quit and keep you from giving in. How can I cope with cravings? Cravings usually last for 5-10 minutes. If you get through it, the craving will pass. Consider taking the following actions to help you cope with cravings:  Keep your mouth busy: ? Chew sugar-free gum. ? Suck on hard candies or a straw. ? Brush your teeth.  Keep your hands and body busy: ? Immediately change to a different activity when you feel a craving. ? Squeeze or play with a ball. ? Do an activity or a hobby, like making bead jewelry, practicing needlepoint, or working with wood. ? Mix up your normal routine. ? Take a short exercise break. Go for a quick walk or run up and down stairs. ? Spend time in public places where smoking is not allowed.  Focus on doing something kind or helpful for someone else.  Call a friend or family member to talk during a craving.  Join a support group.  Call a quit line, such as 1-800-QUIT-NOW.  Talk with your health care provider about medicines that might help you cope with cravings and make quitting easier for you. How can I deal with withdrawal symptoms? Your body may experience negative effects as it tries to get used to not having nicotine in the system. These effects are called withdrawal symptoms. They may include:  Feeling hungrier than normal.  Trouble concentrating.  Irritability.  Trouble sleeping.  Feeling depressed.  Restlessness and agitation.  Craving a cigarette. To manage withdrawal symptoms:  Avoid places, people, and activities that trigger your cravings.  Remember why you want to quit.  Get plenty of sleep.  Avoid coffee and other caffeinated drinks. These may worsen some of your symptoms.  How can I handle social situations? Social situations can be difficult when you are quitting smoking, especially in the first few weeks. To manage this, you can:  Avoid parties, bars, and other social situations where people might be smoking.  Avoid alcohol.  Leave right away if you have the urge to smoke.  Explain to your family and friends that you are quitting smoking. Ask for understanding and support.  Plan activities with friends or family where smoking is not an option. What are some ways I can cope with stress? Wanting to smoke may cause stress, and stress can make you want to smoke. Find ways to manage your stress. Relaxation techniques can help. For example:  Breathe slowly and deeply, in through your nose and out through your mouth.  Listen to soothing, relaxing music.  Talk with a family member or friend about your stress.  Light a candle.  Soak in a bath or take a shower.  Think about a peaceful place. What are some ways I can prevent weight gain? Be aware that many people gain weight after they quit smoking. However, not everyone does. To keep from gaining weight, have a plan in place before you quit and stick to the plan after you quit. Your plan should include:  Having healthy snacks. When you have a craving, it may help to: ? Eat plain popcorn, crunchy carrots, celery, or other cut vegetables. ? Chew sugar-free gum.  Changing how you eat: ? Eat small portion sizes at meals. ? Eat 4-6 small meals   throughout the day instead of 1-2 large meals a day. ? Be mindful when you eat. Do not watch television or do other things that might distract you as you eat.  Exercising regularly: ? Make time to exercise each day. If you do not have time for a long workout, do short bouts of exercise for 5-10 minutes several times a day. ? Do some form of strengthening exercise, like weight lifting, and some form of aerobic exercise, like running or swimming.  Drinking plenty of  water or other low-calorie or no-calorie drinks. Drink 6-8 glasses of water daily, or as much as instructed by your health care provider. Summary  Quitting smoking is a physical and mental challenge. You will face cravings, withdrawal symptoms, and temptation to smoke again. Preparation can help you as you go through these challenges.  You can cope with cravings by keeping your mouth busy (such as by chewing gum), keeping your body and hands busy, and making calls to family, friends, or a helpline for people who want to quit smoking.  You can cope with withdrawal symptoms by avoiding places where people smoke, avoiding drinks with caffeine, and getting plenty of rest.  Ask your health care provider about the different ways to prevent weight gain, avoid stress, and handle social situations. This information is not intended to replace advice given to you by your health care provider. Make sure you discuss any questions you have with your health care provider. Document Released: 02/02/2016 Document Revised: 01/17/2017 Document Reviewed: 02/02/2016 Elsevier Patient Education  2020 [employer]  

## 2019-01-27 LAB — HGB A1C W/O EAG: Hgb A1c MFr Bld: 7.9 % — ABNORMAL HIGH (ref 4.8–5.6)

## 2019-01-27 LAB — PSA: Prostate Specific Ag, Serum: 1.6 ng/mL (ref 0.0–4.0)

## 2019-02-02 DIAGNOSIS — E1165 Type 2 diabetes mellitus with hyperglycemia: Secondary | ICD-10-CM | POA: Diagnosis not present

## 2019-02-02 DIAGNOSIS — E1169 Type 2 diabetes mellitus with other specified complication: Secondary | ICD-10-CM | POA: Diagnosis not present

## 2019-02-02 DIAGNOSIS — F17200 Nicotine dependence, unspecified, uncomplicated: Secondary | ICD-10-CM | POA: Diagnosis not present

## 2019-02-02 DIAGNOSIS — E1159 Type 2 diabetes mellitus with other circulatory complications: Secondary | ICD-10-CM | POA: Diagnosis not present

## 2019-04-19 LAB — HM DIABETES EYE EXAM

## 2019-05-01 ENCOUNTER — Ambulatory Visit: Payer: Self-pay | Attending: Internal Medicine

## 2019-05-01 ENCOUNTER — Other Ambulatory Visit: Payer: Self-pay

## 2019-05-01 DIAGNOSIS — Z23 Encounter for immunization: Secondary | ICD-10-CM

## 2019-05-01 NOTE — Progress Notes (Signed)
   Covid-19 Vaccination [facility]  Name:  Jacob Coleman    MRN: [id number] DOB: December 28, 1965  05/01/2019  Jacob Coleman was observed post Covid-19 immunization for 15 minutes without incident. He was provided with Vaccine Information Sheet and instruction to access the V-Safe system.   Jacob Coleman was instructed to call 911 with any severe reactions post vaccine: Marland Kitchen Difficulty breathing  . Swelling of face and throat  . A fast heartbeat  . A bad rash all over body  . Dizziness and weakness   Immunizations Administered    Name Date Dose VIS Date Route   Pfizer COVID-19 Vaccine 05/01/2019 10:03 AM 0.3 mL 01/29/2019 Intramuscular   Manufacturer: [place]   Lot: [id number]   [place]: [id number]

## 2019-05-25 ENCOUNTER — Ambulatory Visit: Payer: Self-pay | Attending: Internal Medicine

## 2019-05-25 ENCOUNTER — Other Ambulatory Visit: Payer: Self-pay

## 2019-05-25 DIAGNOSIS — Z23 Encounter for immunization: Secondary | ICD-10-CM

## 2019-05-25 NOTE — Progress Notes (Signed)
   Covid-19 Vaccination Clinic  Name:  Jacob Coleman    MRN: [id number] DOB: 05-06-1965  05/25/2019  Jacob Coleman was observed post Covid-19 immunization for 15 minutes without incident. He was provided with Vaccine Information Sheet and instruction to access the V-Safe system.   Jacob Coleman was instructed to call 911 with any severe reactions post vaccine: Marland Kitchen Difficulty breathing  . Swelling of face and throat  . A fast heartbeat  . A bad rash all over body  . Dizziness and weakness   Immunizations Administered    Name Date Dose VIS Date Route   Pfizer COVID-19 Vaccine 05/25/2019  3:13 PM 0.3 mL 01/29/2019 Intramuscular   Manufacturer: [place]   Lot: [id number]   [place]: [id number]

## 2019-07-21 ENCOUNTER — Other Ambulatory Visit: Payer: Self-pay | Admitting: Family Medicine

## 2019-07-21 DIAGNOSIS — E785 Hyperlipidemia, unspecified: Secondary | ICD-10-CM

## 2019-07-21 NOTE — Telephone Encounter (Signed)
Requested Prescriptions  Pending Prescriptions Disp Refills  . atorvastatin (LIPITOR) 10 MG tablet [Pharmacy Med Name: ATORVASTATIN 10 MG TABLET] 90 tablet 0    Sig: TAKE 1 TABLET BY MOUTH EVERY DAY     Cardiovascular:  Antilipid - Statins Failed - 07/21/2019  1:18 AM      Failed - HDL in normal range and within 360 days    HDL  Date Value Ref Range Status  08/19/2018 37 (L) >39 mg/dL Final         Failed - Triglycerides in normal range and within 360 days    Triglycerides  Date Value Ref Range Status  08/19/2018 201 (H) 0 - 149 mg/dL Final         Passed - Total Cholesterol in normal range and within 360 days    Cholesterol, Total  Date Value Ref Range Status  08/19/2018 147 100 - 199 mg/dL Final         Passed - LDL in normal range and within 360 days    LDL Calculated  Date Value Ref Range Status  08/19/2018 70 0 - 99 mg/dL Final   LDL Direct  Date Value Ref Range Status  06/29/2018 102 (H) 0 - 99 mg/dL Final         Passed - Patient is not pregnant      Passed - Valid encounter within last 12 months    Recent Outpatient Visits          5 months ago Annual physical exam   [place], Deanna C, MD   11 months ago Essential hypertension   [place], Deanna C, MD   1 year ago Diabetes mellitus without complication [facility])   [facility] Juline Patch, MD   1 year ago Establishing care with new doctor, encounter for   [place], MD      Future Appointments            In 6 months Juline Patch, MD [facility], [facility]           Needs lipid panel.

## 2019-09-24 ENCOUNTER — Other Ambulatory Visit: Payer: Self-pay | Admitting: Family Medicine

## 2019-09-24 DIAGNOSIS — E785 Hyperlipidemia, unspecified: Secondary | ICD-10-CM

## 2019-10-05 DIAGNOSIS — E1169 Type 2 diabetes mellitus with other specified complication: Secondary | ICD-10-CM | POA: Diagnosis not present

## 2019-10-05 DIAGNOSIS — F17200 Nicotine dependence, unspecified, uncomplicated: Secondary | ICD-10-CM | POA: Diagnosis not present

## 2019-10-05 DIAGNOSIS — E1159 Type 2 diabetes mellitus with other circulatory complications: Secondary | ICD-10-CM | POA: Diagnosis not present

## 2019-10-05 DIAGNOSIS — Z794 Long term (current) use of insulin: Secondary | ICD-10-CM | POA: Diagnosis not present

## 2019-10-05 DIAGNOSIS — E1165 Type 2 diabetes mellitus with hyperglycemia: Secondary | ICD-10-CM | POA: Diagnosis not present

## 2019-10-05 LAB — HM DIABETES FOOT EXAM: HM Diabetic Foot Exam: NORMAL

## 2019-10-05 LAB — HEMOGLOBIN A1C: Hemoglobin A1C: 7.4

## 2019-10-22 ENCOUNTER — Other Ambulatory Visit: Payer: Self-pay | Admitting: Family Medicine

## 2019-10-22 DIAGNOSIS — E785 Hyperlipidemia, unspecified: Secondary | ICD-10-CM

## 2019-12-13 DIAGNOSIS — Z86018 Personal history of other benign neoplasm: Secondary | ICD-10-CM | POA: Diagnosis not present

## 2019-12-13 DIAGNOSIS — Z85828 Personal history of other malignant neoplasm of skin: Secondary | ICD-10-CM | POA: Diagnosis not present

## 2019-12-13 DIAGNOSIS — Z872 Personal history of diseases of the skin and subcutaneous tissue: Secondary | ICD-10-CM | POA: Diagnosis not present

## 2019-12-13 DIAGNOSIS — L570 Actinic keratosis: Secondary | ICD-10-CM | POA: Diagnosis not present

## 2019-12-13 DIAGNOSIS — L578 Other skin changes due to chronic exposure to nonionizing radiation: Secondary | ICD-10-CM | POA: Diagnosis not present

## 2019-12-13 DIAGNOSIS — D485 Neoplasm of uncertain behavior of skin: Secondary | ICD-10-CM | POA: Diagnosis not present

## 2019-12-18 ENCOUNTER — Other Ambulatory Visit: Payer: Self-pay | Admitting: Family Medicine

## 2019-12-18 DIAGNOSIS — I10 Essential (primary) hypertension: Secondary | ICD-10-CM

## 2019-12-18 NOTE — Telephone Encounter (Signed)
Requested medications are due for refill today yes  Requested medications are on the active medication list yes  Last refill 10/4  Last visit 01/2019  Future visit scheduled 01/2020  Notes to clinic Failed protocol of visit within 6 months.

## 2020-01-17 ENCOUNTER — Other Ambulatory Visit: Payer: Self-pay | Admitting: Family Medicine

## 2020-01-17 DIAGNOSIS — I10 Essential (primary) hypertension: Secondary | ICD-10-CM

## 2020-01-17 NOTE — Telephone Encounter (Signed)
Requested Prescriptions  Pending Prescriptions Disp Refills  . lisinopril (ZESTRIL) 10 MG tablet [Pharmacy Med Name: LISINOPRIL 10 MG TABLET] 10 tablet 0    Sig: TAKE 1 TABLET BY MOUTH EVERY DAY     Cardiovascular:  ACE Inhibitors Failed - 01/17/2020 12:33 PM      Failed - Cr in normal range and within 180 days    Creatinine, Ser  Date Value Ref Range Status  08/19/2018 1.03 0.76 - 1.27 mg/dL Final         Failed - K in normal range and within 180 days    Potassium  Date Value Ref Range Status  08/19/2018 4.4 3.5 - 5.2 mmol/L Final         Failed - Valid encounter within last 6 months    Recent Outpatient Visits          11 months ago Annual physical exam   [place], Deanna C, MD   1 year ago Essential hypertension   [place], Deanna C, MD   1 year ago Diabetes mellitus without complication [facility])   [facility] Juline Patch, MD   1 year ago Establishing care with new doctor, encounter for   [place], MD      Future Appointments            In 1 week Juline Patch, MD [facility] [place] - Patient is not pregnant      Passed - Last BP in normal range    BP Readings from Last 1 Encounters:  01/26/19 120/82         Patient has scheduled appointment 01/28/20. Given 10 additional doses to cover until appointment.

## 2020-01-18 ENCOUNTER — Other Ambulatory Visit: Payer: Self-pay | Admitting: Family Medicine

## 2020-01-18 DIAGNOSIS — E785 Hyperlipidemia, unspecified: Secondary | ICD-10-CM

## 2020-01-18 NOTE — Telephone Encounter (Signed)
Refills

## 2020-01-28 ENCOUNTER — Encounter: Payer: Self-pay | Admitting: Family Medicine

## 2020-01-28 ENCOUNTER — Ambulatory Visit (INDEPENDENT_AMBULATORY_CARE_PROVIDER_SITE_OTHER): Payer: BC Managed Care – PPO | Admitting: Family Medicine

## 2020-01-28 ENCOUNTER — Other Ambulatory Visit: Payer: Self-pay

## 2020-01-28 VITALS — BP 124/80 | HR 80 | Ht 72.0 in | Wt 212.0 lb

## 2020-01-28 DIAGNOSIS — R351 Nocturia: Secondary | ICD-10-CM | POA: Diagnosis not present

## 2020-01-28 DIAGNOSIS — E663 Overweight: Secondary | ICD-10-CM

## 2020-01-28 DIAGNOSIS — E785 Hyperlipidemia, unspecified: Secondary | ICD-10-CM | POA: Diagnosis not present

## 2020-01-28 DIAGNOSIS — F17210 Nicotine dependence, cigarettes, uncomplicated: Secondary | ICD-10-CM

## 2020-01-28 DIAGNOSIS — I10 Essential (primary) hypertension: Secondary | ICD-10-CM | POA: Diagnosis not present

## 2020-01-28 DIAGNOSIS — Z1211 Encounter for screening for malignant neoplasm of colon: Secondary | ICD-10-CM | POA: Diagnosis not present

## 2020-01-28 DIAGNOSIS — Z0000 Encounter for general adult medical examination without abnormal findings: Secondary | ICD-10-CM

## 2020-01-28 DIAGNOSIS — Z23 Encounter for immunization: Secondary | ICD-10-CM

## 2020-01-28 LAB — HEMOCCULT GUIAC POC 1CARD (OFFICE): Fecal Occult Blood, POC: NEGATIVE

## 2020-01-28 MED ORDER — LISINOPRIL 10 MG PO TABS: 10.0000 mg | ORAL_TABLET | Freq: Every day | ORAL | 1 refills | Status: DC

## 2020-01-28 MED ORDER — ATORVASTATIN CALCIUM 10 MG PO TABS: 10.0000 mg | ORAL_TABLET | Freq: Every day | ORAL | 1 refills | Status: DC

## 2020-01-28 NOTE — Progress Notes (Signed)
Date:  01/28/2020   Name:  Jacob Coleman   DOB:  Jun 03, 1965   MRN:  [id number]   Chief Complaint: Annual Exam and Flu Vaccine  Patient is a 54 year old male who presents for a comprehensive physical exam. The patient reports the following problems: reactive anxiety. Health maintenance has been reviewed up to date.   Lab Results  Component Value Date   CREATININE 1.03 08/19/2018   BUN 15 08/19/2018   NA 138 08/19/2018   K 4.4 08/19/2018   CL 101 08/19/2018   CO2 20 08/19/2018   Lab Results  Component Value Date   CHOL 147 08/19/2018   HDL 37 (L) 08/19/2018   LDLCALC 70 08/19/2018   LDLDIRECT 102 (H) 06/29/2018   TRIG 201 (H) 08/19/2018   CHOLHDL 4.0 08/19/2018   No results found for: TSH Lab Results  Component Value Date   HGBA1C 7.4 10/05/2019   No results found for: WBC, HGB, HCT, MCV, PLT Lab Results  Component Value Date   ALT 23 08/19/2018   AST 11 08/19/2018   ALKPHOS 126 (H) 08/19/2018   BILITOT 0.3 08/19/2018     Review of Systems  Constitutional: Negative for chills and fever.  HENT: Negative for drooling, ear discharge, ear pain and sore throat.   Respiratory: Negative for cough, shortness of breath and wheezing.   Cardiovascular: Negative for chest pain, palpitations and leg swelling.  Gastrointestinal: Negative for abdominal pain, blood in stool, constipation, diarrhea and nausea.  Endocrine: Negative for polydipsia.  Genitourinary: Negative for dysuria, frequency, hematuria and urgency.  Musculoskeletal: Negative for back pain, myalgias and neck pain.  Skin: Negative for rash.  Allergic/Immunologic: Negative for environmental allergies.  Neurological: Negative for dizziness and headaches.  Hematological: Does not bruise/bleed easily.  Psychiatric/Behavioral: Negative for suicidal ideas. The patient is not nervous/anxious.     Patient Active Problem List   Diagnosis Date Noted  . Essential hypertension 06/29/2018  . Adhesive capsulitis  of left shoulder 06/26/2017  . DDD (degenerative disc disease), lumbar 03/16/2014  . Low back pain 03/16/2014  . Lumbar radicular pain 03/16/2014  . Superior glenoid labrum lesion 02/10/2014  . Aftercare following surgery for neoplasm 01/20/2012  . Basal cell carcinoma 12/09/2011  . ED (erectile dysfunction) 10/08/2011  . Dyslipidemia 08/06/2011  . Diabetes mellitus without complication ([place]) 09/98/3382  . Insomnia 05/07/2011    No Known Allergies  No past surgical history on file.  Social History   Tobacco Use  . Smoking status: Current Every Day Smoker    Types: E-cigarettes  . Smokeless tobacco: Never Used  . Tobacco comment: wants info on quiting  Substance Use Topics  . Alcohol use: Yes  . Drug use: Never     Medication list has been reviewed and updated.  Current Meds  Medication Sig  . aspirin EC 81 MG tablet Take 1 tablet by mouth daily.  Marland Kitchen atorvastatin (LIPITOR) 10 MG tablet TAKE 1 TABLET BY MOUTH EVERY DAY  . glimepiride (AMARYL) 2 MG tablet Take 1 tablet (2 mg total) by mouth daily before breakfast. (Patient taking differently: Take 2 mg by mouth daily before breakfast. solum)  . lisinopril (ZESTRIL) 10 MG tablet TAKE 1 TABLET BY MOUTH EVERY DAY  . metFORMIN (GLUCOPHAGE) 1000 MG tablet Take 1 tablet (1,000 mg total) by mouth 2 (two) times daily. (Patient taking differently: Take 1,000 mg by mouth 2 (two) times daily. solum)    PHQ 2/9 Scores 01/28/2020 01/26/2019 08/19/2018 05/18/2018  PHQ -  2 Score 0 0 0 0  PHQ- 9 Score 0 0 0 0    No flowsheet data found.  BP Readings from Last 3 Encounters:  01/28/20 124/80  01/26/19 120/82  08/19/18 138/78    Physical Exam Vitals and nursing note reviewed.  Constitutional:      Appearance: Normal appearance. He is well-developed and overweight.  HENT:     Head: Normocephalic.     Jaw: There is normal jaw occlusion.     Right Ear: Hearing, tympanic membrane, ear canal and external ear normal.     Left Ear:  Hearing, tympanic membrane, ear canal and external ear normal.     Nose: Nose normal. No congestion or rhinorrhea.     Mouth/Throat:     Lips: Pink.     Mouth: Oropharynx is clear and moist. Mucous membranes are moist.     Dentition: Normal dentition.     Tongue: No lesions.     Pharynx: Oropharynx is clear. Uvula midline.     Tonsils: No tonsillar exudate or tonsillar abscesses.  Eyes:     General: Lids are normal. Vision grossly intact. Gaze aligned appropriately. No scleral icterus.       Right eye: No discharge.        Left eye: No discharge.     Extraocular Movements: Extraocular movements intact and EOM normal.     Conjunctiva/sclera: Conjunctivae normal.     Pupils: Pupils are equal, round, and reactive to light.     Funduscopic exam:    Right eye: Red reflex present.        Left eye: Red reflex present. Neck:     Thyroid: No thyroid mass, thyromegaly or thyroid tenderness.     Vascular: Normal carotid pulses. No carotid bruit, hepatojugular reflux or JVD.     Trachea: No tracheal deviation.  Cardiovascular:     Rate and Rhythm: Normal rate and regular rhythm.     Pulses: Normal pulses and intact distal pulses.          Carotid pulses are 2+ on the right side and 2+ on the left side.      Radial pulses are 2+ on the right side and 2+ on the left side.       Femoral pulses are 2+ on the right side and 2+ on the left side.      Popliteal pulses are 2+ on the right side and 2+ on the left side.       Dorsalis pedis pulses are 2+ on the right side and 2+ on the left side.       Posterior tibial pulses are 2+ on the right side and 2+ on the left side.     Heart sounds: Normal heart sounds, S1 normal and S2 normal. No murmur heard.  No systolic murmur is present.  No diastolic murmur is present. No friction rub. No gallop. No S3 or S4 sounds.   Pulmonary:     Effort: Pulmonary effort is normal. No respiratory distress.     Breath sounds: Normal breath sounds. No stridor or  decreased air movement. No decreased breath sounds, wheezing, rhonchi or rales.  Chest:     Chest wall: No tenderness.  Breasts:     Right: Normal. No axillary adenopathy or supraclavicular adenopathy.     Left: Normal. No axillary adenopathy or supraclavicular adenopathy.    Abdominal:     General: Bowel sounds are normal.     Palpations: Abdomen is soft. There  is no hepatomegaly, splenomegaly, hepatosplenomegaly or mass.     Tenderness: There is no abdominal tenderness. There is no CVA tenderness, guarding or rebound.     Hernia: No hernia is present. There is no hernia in the umbilical area, ventral area, left inguinal area or right inguinal area.  Genitourinary:    Penis: Normal and circumcised.      Testes: Normal.        Right: Mass not present.        Left: Mass not present.     Epididymis:     Right: Normal.     Left: Normal.     Prostate: Normal. Not enlarged, not tender and no nodules present.     Rectum: Normal. Guaiac result negative. No mass.  Musculoskeletal:        General: No tenderness or edema. Normal range of motion.     Cervical back: Full passive range of motion without pain, normal range of motion and neck supple.     Right lower leg: No edema.     Left lower leg: No edema.  Lymphadenopathy:     Head:     Right side of head: No submandibular adenopathy.     Left side of head: No submandibular adenopathy.     Cervical: No cervical adenopathy.     Right cervical: No superficial, deep or posterior cervical adenopathy.    Left cervical: No superficial, deep or posterior cervical adenopathy.     Upper Body:     Right upper body: No supraclavicular or axillary adenopathy.     Left upper body: No supraclavicular or axillary adenopathy.     Lower Body: No right inguinal adenopathy. No left inguinal adenopathy.  Skin:    General: Skin is warm.     Capillary Refill: Capillary refill takes less than 2 seconds.     Findings: No rash.  Neurological:     Mental  Status: He is alert and oriented to person, place, and time.     Cranial Nerves: Cranial nerves are intact. No cranial nerve deficit.     Sensory: Sensation is intact.     Motor: Motor function is intact.     Deep Tendon Reflexes: Strength normal and reflexes are normal and symmetric.  Psychiatric:        Attention and Perception: Attention and perception normal.        Mood and Affect: Mood and affect normal.        Speech: Speech normal.        Cognition and Memory: Cognition normal.     Wt Readings from Last 3 Encounters:  01/28/20 212 lb (96.2 kg)  01/26/19 208 lb (94.3 kg)  08/19/18 208 lb (94.3 kg)    BP 124/80   Pulse 80   Ht 6' (1.829 m)   Wt 212 lb (96.2 kg)   BMI 28.75 kg/m   Assessment and Plan: Patient's chart was reviewed for previous encounters, most recent labs, most recent imaging, and care everywhere. 1. Annual physical exam No subjective/objective concerns noted during history and physical exam.Jacob Coleman is a 54 y.o. male who presents today for his Complete Annual Exam. He feels well. He reports exercising . He reports he is sleeping well.Immunizations are reviewed and recommendations provided.   Age appropriate screening tests are discussed. Counseling given for risk factor reduction interventions.  - POCT Occult Blood Stool  2. Essential hypertension Chronic.  Controlled.  Stable.  Blood pressure 124/80.  We will continue lisinopril  10 mg once a day.  And check a CMP for electrolytes and GFR status - Comprehensive Metabolic Panel (CMET) - lisinopril (ZESTRIL) 10 MG tablet; Take 1 tablet (10 mg total) by mouth daily.  Dispense: 90 tablet; Refill: 1  3. Dyslipidemia Chronic.  Controlled.  Stable.  Continue atorvastatin 10 mg once a day.  Will check lipid panel for current status. - Lipid Panel With LDL/HDL Ratio - atorvastatin (LIPITOR) 10 MG tablet; Take 1 tablet (10 mg total) by mouth daily.  Dispense: 90 tablet; Refill: 1  4. Nocturia Patient's  had some mild nocturia.  DRE was normal today.  We will check a PSA. - PSA  5. Cigarette nicotine dependence without complication Patient has been advised of the health risks of smoking and counseled concerning cessation of tobacco products. I spent over 3 minutes for discussion and to answer questions.  6. Overweight (BMI 25.0-29.9) Health risks of being over weight were discussed and patient was counseled on weight loss options and exercise.  7. Need for immunization against influenza Discussed and administered. - Flu Vaccine QUAD 36+ mos IM  8. Colon cancer screening Hemoccult was noted to be negative today. - POCT Occult Blood Stool

## 2020-01-28 NOTE — Patient Instructions (Signed)
DASH Eating Plan DASH stands for "Dietary Approaches to Stop Hypertension." The DASH eating plan is a healthy eating plan that has been shown to reduce high blood pressure (hypertension). It may also reduce your risk for type 2 diabetes, heart disease, and stroke. The DASH eating plan may also help with weight loss. What are tips for following this plan?  General guidelines  Avoid eating more than 2,300 mg (milligrams) of salt (sodium) a day. If you have hypertension, you may need to reduce your sodium intake to 1,500 mg a day.  Limit alcohol intake to no more than 1 drink a day for nonpregnant women and 2 drinks a day for men. One drink equals 12 oz of beer, 5 oz of wine, or 1 oz of hard liquor.  Work with your health care provider to maintain a healthy body weight or to lose weight. Ask what an ideal weight is for you.  Get at least 30 minutes of exercise that causes your heart to beat faster (aerobic exercise) most days of the week. Activities may include walking, swimming, or biking.  Work with your health care provider or diet and nutrition specialist (dietitian) to adjust your eating plan to your individual calorie needs. Reading food labels   Check food labels for the amount of sodium per serving. Choose foods with less than 5 percent of the Daily Value of sodium. Generally, foods with less than 300 mg of sodium per serving fit into this eating plan.  To find whole grains, look for the word "whole" as the first word in the ingredient list. Shopping  Buy products labeled as "low-sodium" or "no salt added."  Buy fresh foods. Avoid canned foods and premade or frozen meals. Cooking  Avoid adding salt when cooking. Use salt-free seasonings or herbs instead of table salt or sea salt. Check with your health care provider or pharmacist before using salt substitutes.  Do not fry foods. Cook foods using healthy methods such as baking, boiling, grilling, and broiling instead.  Cook with  heart-healthy oils, such as olive, canola, soybean, or sunflower oil. Meal planning  Eat a balanced diet that includes: ? 5 or more servings of fruits and vegetables each day. At each meal, try to fill half of your plate with fruits and vegetables. ? Up to 6-8 servings of whole grains each day. ? Less than 6 oz of lean meat, poultry, or fish each day. A 3-oz serving of meat is about the same size as a deck of cards. One egg equals 1 oz. ? 2 servings of low-fat dairy each day. ? A serving of nuts, seeds, or beans 5 times each week. ? Heart-healthy fats. Healthy fats called Omega-3 fatty acids are found in foods such as flaxseeds and coldwater fish, like sardines, salmon, and mackerel.  Limit how much you eat of the following: ? Canned or prepackaged foods. ? Food that is high in trans fat, such as fried foods. ? Food that is high in saturated fat, such as fatty meat. ? Sweets, desserts, sugary drinks, and other foods with added sugar. ? Full-fat dairy products.  Do not salt foods before eating.  Try to eat at least 2 vegetarian meals each week.  Eat more home-cooked food and less restaurant, buffet, and fast food.  When eating at a restaurant, ask that your food be prepared with less salt or no salt, if possible. What foods are recommended? The items listed may not be a complete list. Talk with your dietitian about   what dietary choices are best for you. Grains Whole-grain or whole-wheat bread. Whole-grain or whole-wheat pasta. Brown rice. Oatmeal. Quinoa. Bulgur. Whole-grain and low-sodium cereals. Pita bread. Low-fat, low-sodium crackers. Whole-wheat flour tortillas. Vegetables Fresh or frozen vegetables (raw, steamed, roasted, or grilled). Low-sodium or reduced-sodium tomato and vegetable juice. Low-sodium or reduced-sodium tomato sauce and tomato paste. Low-sodium or reduced-sodium canned vegetables. Fruits All fresh, dried, or frozen fruit. Canned fruit in natural juice (without  added sugar). Meat and other protein foods Skinless chicken or turkey. Ground chicken or turkey. Pork with fat trimmed off. Fish and seafood. Egg whites. Dried beans, peas, or lentils. Unsalted nuts, nut butters, and seeds. Unsalted canned beans. Lean cuts of beef with fat trimmed off. Low-sodium, lean deli meat. Dairy Low-fat (1%) or fat-free (skim) milk. Fat-free, low-fat, or reduced-fat cheeses. Nonfat, low-sodium ricotta or cottage cheese. Low-fat or nonfat yogurt. Low-fat, low-sodium cheese. Fats and oils Soft margarine without trans fats. Vegetable oil. Low-fat, reduced-fat, or light mayonnaise and salad dressings (reduced-sodium). Canola, safflower, olive, soybean, and sunflower oils. Avocado. Seasoning and other foods Herbs. Spices. Seasoning mixes without salt. Unsalted popcorn and pretzels. Fat-free sweets. What foods are not recommended? The items listed may not be a complete list. Talk with your dietitian about what dietary choices are best for you. Grains Baked goods made with fat, such as croissants, muffins, or some breads. Dry pasta or rice meal packs. Vegetables Creamed or fried vegetables. Vegetables in a cheese sauce. Regular canned vegetables (not low-sodium or reduced-sodium). Regular canned tomato sauce and paste (not low-sodium or reduced-sodium). Regular tomato and vegetable juice (not low-sodium or reduced-sodium). Pickles. Olives. Fruits Canned fruit in a light or heavy syrup. Fried fruit. Fruit in cream or butter sauce. Meat and other protein foods Fatty cuts of meat. Ribs. Fried meat. Bacon. Sausage. Bologna and other processed lunch meats. Salami. Fatback. Hotdogs. Bratwurst. Salted nuts and seeds. Canned beans with added salt. Canned or smoked fish. Whole eggs or egg yolks. Chicken or turkey with skin. Dairy Whole or 2% milk, cream, and half-and-half. Whole or full-fat cream cheese. Whole-fat or sweetened yogurt. Full-fat cheese. Nondairy creamers. Whipped toppings.  Processed cheese and cheese spreads. Fats and oils Butter. Stick margarine. Lard. Shortening. Ghee. Bacon fat. Tropical oils, such as coconut, palm kernel, or palm oil. Seasoning and other foods Salted popcorn and pretzels. Onion salt, garlic salt, seasoned salt, table salt, and sea salt. Worcestershire sauce. Tartar sauce. Barbecue sauce. Teriyaki sauce. Soy sauce, including reduced-sodium. Steak sauce. Canned and packaged gravies. Fish sauce. Oyster sauce. Cocktail sauce. Horseradish that you find on the shelf. Ketchup. Mustard. Meat flavorings and tenderizers. Bouillon cubes. Hot sauce and Tabasco sauce. Premade or packaged marinades. Premade or packaged taco seasonings. Relishes. Regular salad dressings. Where to find more information:  National Heart, Lung, and Blood Institute: www.nhlbi.nih.gov  American Heart Association: www.heart.org Summary  The DASH eating plan is a healthy eating plan that has been shown to reduce high blood pressure (hypertension). It may also reduce your risk for type 2 diabetes, heart disease, and stroke.  With the DASH eating plan, you should limit salt (sodium) intake to 2,300 mg a day. If you have hypertension, you may need to reduce your sodium intake to 1,500 mg a day.  When on the DASH eating plan, aim to eat more fresh fruits and vegetables, whole grains, lean proteins, low-fat dairy, and heart-healthy fats.  Work with your health care provider or diet and nutrition specialist (dietitian) to adjust your eating plan to your   individual calorie needs. This information is not intended to replace advice given to you by your health care provider. Make sure you discuss any questions you have with your health care provider. Document Revised: 01/17/2017 Document Reviewed: 01/29/2016 Elsevier Patient Education  2020 [employer]  

## 2020-01-29 LAB — LIPID PANEL WITH LDL/HDL RATIO
Cholesterol, Total: 141 mg/dL (ref 100–199)
HDL: 35 mg/dL — ABNORMAL LOW (ref >39)
LDL Chol Calc (NIH): 78 mg/dL (ref 0–99)
LDL/HDL Ratio: 2.2 ratio (ref 0.0–3.6)
Triglycerides: 164 mg/dL — ABNORMAL HIGH (ref 0–149)
VLDL Cholesterol Cal: 28 mg/dL (ref 5–40)

## 2020-01-29 LAB — COMPREHENSIVE METABOLIC PANEL
ALT: 36 IU/L (ref 0–44)
AST: 18 IU/L (ref 0–40)
Albumin/Globulin Ratio: 2.1 (ref 1.2–2.2)
Albumin: 5 g/dL — ABNORMAL HIGH (ref 3.8–4.9)
Alkaline Phosphatase: 116 IU/L (ref 44–121)
BUN/Creatinine Ratio: 19 (ref 9–20)
BUN: 18 mg/dL (ref 6–24)
Bilirubin Total: 0.4 mg/dL (ref 0.0–1.2)
CO2: 22 mmol/L (ref 20–29)
Calcium: 10.3 mg/dL — ABNORMAL HIGH (ref 8.7–10.2)
Chloride: 97 mmol/L (ref 96–106)
Creatinine, Ser: 0.96 mg/dL (ref 0.76–1.27)
GFR calc Af Amer: 103 mL/min/{1.73_m2} (ref >59)
GFR calc non Af Amer: 89 mL/min/{1.73_m2} (ref >59)
Globulin, Total: 2.4 g/dL (ref 1.5–4.5)
Glucose: 166 mg/dL — ABNORMAL HIGH (ref 65–99)
Potassium: 4.5 mmol/L (ref 3.5–5.2)
Sodium: 138 mmol/L (ref 134–144)
Total Protein: 7.4 g/dL (ref 6.0–8.5)

## 2020-01-29 LAB — PSA: Prostate Specific Ag, Serum: 1.4 ng/mL (ref 0.0–4.0)

## 2020-02-28 ENCOUNTER — Ambulatory Visit (INDEPENDENT_AMBULATORY_CARE_PROVIDER_SITE_OTHER): Payer: BC Managed Care – PPO

## 2020-02-28 ENCOUNTER — Other Ambulatory Visit: Payer: Self-pay

## 2020-02-28 DIAGNOSIS — Z23 Encounter for immunization: Secondary | ICD-10-CM

## 2020-04-12 ENCOUNTER — Other Ambulatory Visit: Payer: Self-pay | Admitting: Family Medicine

## 2020-04-12 ENCOUNTER — Other Ambulatory Visit: Payer: Self-pay

## 2020-04-12 DIAGNOSIS — E785 Hyperlipidemia, unspecified: Secondary | ICD-10-CM

## 2020-04-12 MED ORDER — ATORVASTATIN CALCIUM 10 MG PO TABS: 10.0000 mg | ORAL_TABLET | Freq: Every day | ORAL | 2 refills | Status: DC

## 2020-05-15 ENCOUNTER — Ambulatory Visit: Payer: Self-pay | Admitting: *Deleted

## 2020-05-15 ENCOUNTER — Other Ambulatory Visit: Payer: Self-pay

## 2020-05-15 ENCOUNTER — Emergency Department: Payer: BC Managed Care – PPO

## 2020-05-15 ENCOUNTER — Inpatient Hospital Stay
Admission: EM | Admit: 2020-05-15 | Discharge: 2020-05-18 | DRG: 419 | Disposition: A | Discharge status: Home / Self Care | Payer: BC Managed Care – PPO | Attending: General Surgery | Admitting: General Surgery

## 2020-05-15 DIAGNOSIS — Z20822 Contact with and (suspected) exposure to covid-19: Secondary | ICD-10-CM | POA: Diagnosis present

## 2020-05-15 DIAGNOSIS — Z7982 Long term (current) use of aspirin: Secondary | ICD-10-CM

## 2020-05-15 DIAGNOSIS — K82A1 Gangrene of gallbladder in cholecystitis: Secondary | ICD-10-CM | POA: Diagnosis not present

## 2020-05-15 DIAGNOSIS — K810 Acute cholecystitis: Secondary | ICD-10-CM | POA: Diagnosis present

## 2020-05-15 DIAGNOSIS — Z79899 Other long term (current) drug therapy: Secondary | ICD-10-CM

## 2020-05-15 DIAGNOSIS — F17290 Nicotine dependence, other tobacco product, uncomplicated: Secondary | ICD-10-CM | POA: Diagnosis present

## 2020-05-15 DIAGNOSIS — Z7984 Long term (current) use of oral hypoglycemic drugs: Secondary | ICD-10-CM | POA: Diagnosis not present

## 2020-05-15 DIAGNOSIS — K77 Liver disorders in diseases classified elsewhere: Secondary | ICD-10-CM | POA: Diagnosis not present

## 2020-05-15 DIAGNOSIS — E119 Type 2 diabetes mellitus without complications: Secondary | ICD-10-CM | POA: Diagnosis present

## 2020-05-15 DIAGNOSIS — I10 Essential (primary) hypertension: Secondary | ICD-10-CM | POA: Diagnosis present

## 2020-05-15 DIAGNOSIS — Z85828 Personal history of other malignant neoplasm of skin: Secondary | ICD-10-CM

## 2020-05-15 DIAGNOSIS — K8000 Calculus of gallbladder with acute cholecystitis without obstruction: Secondary | ICD-10-CM | POA: Diagnosis present

## 2020-05-15 DIAGNOSIS — R109 Unspecified abdominal pain: Secondary | ICD-10-CM

## 2020-05-15 LAB — CBC
HCT: 49.7 % (ref 39.0–52.0)
Hemoglobin: 18 g/dL — ABNORMAL HIGH (ref 13.0–17.0)
MCH: 33.3 pg (ref 26.0–34.0)
MCHC: 36.2 g/dL — ABNORMAL HIGH (ref 30.0–36.0)
MCV: 92 fL (ref 80.0–100.0)
Platelets: 261 10*3/uL (ref 150–400)
RBC: 5.4 MIL/uL (ref 4.22–5.81)
RDW: 11.9 % (ref 11.5–15.5)
WBC: 16.5 10*3/uL — ABNORMAL HIGH (ref 4.0–10.5)
nRBC: 0 % (ref 0.0–0.2)

## 2020-05-15 LAB — RESP PANEL BY RT-PCR (FLU A&B, COVID) ARPGX2
Influenza A by PCR: NEGATIVE
Influenza B by PCR: NEGATIVE
SARS Coronavirus 2 by RT PCR: NEGATIVE

## 2020-05-15 LAB — URINALYSIS, COMPLETE (UACMP) WITH MICROSCOPIC
Bacteria, UA: NONE SEEN
Bilirubin Urine: NEGATIVE
Glucose, UA: >=500 mg/dL — AB
Ketones, ur: 80 mg/dL — AB
Leukocytes,Ua: NEGATIVE
Nitrite: NEGATIVE
Protein, ur: 30 mg/dL — AB
Specific Gravity, Urine: 1.026 (ref 1.005–1.030)
Squamous Epithelial / HPF: NONE SEEN (ref 0–5)
pH: 5 (ref 5.0–8.0)

## 2020-05-15 LAB — COMPREHENSIVE METABOLIC PANEL
ALT: 28 U/L (ref 0–44)
AST: 13 U/L — ABNORMAL LOW (ref 15–41)
Albumin: 4.7 g/dL (ref 3.5–5.0)
Alkaline Phosphatase: 84 U/L (ref 38–126)
Anion gap: 13 (ref 5–15)
BUN: 14 mg/dL (ref 6–20)
CO2: 19 mmol/L — ABNORMAL LOW (ref 22–32)
Calcium: 9.6 mg/dL (ref 8.9–10.3)
Chloride: 102 mmol/L (ref 98–111)
Creatinine, Ser: 0.68 mg/dL (ref 0.61–1.24)
GFR, Estimated: >60 mL/min (ref >60)
Glucose, Bld: 112 mg/dL — ABNORMAL HIGH (ref 70–99)
Potassium: 3.9 mmol/L (ref 3.5–5.1)
Sodium: 134 mmol/L — ABNORMAL LOW (ref 135–145)
Total Bilirubin: 1.4 mg/dL — ABNORMAL HIGH (ref 0.3–1.2)
Total Protein: 7.6 g/dL (ref 6.5–8.1)

## 2020-05-15 LAB — CBG MONITORING, ED: Glucose-Capillary: 85 mg/dL (ref 70–99)

## 2020-05-15 LAB — GLUCOSE, CAPILLARY: Glucose-Capillary: 95 mg/dL (ref 70–99)

## 2020-05-15 LAB — LIPASE, BLOOD: Lipase: 32 U/L (ref 11–51)

## 2020-05-15 MED ORDER — ACETAMINOPHEN 500 MG PO TABS
1000.0000 mg | ORAL_TABLET | Freq: Four times a day (QID) | ORAL | Status: DC
  Administered 2020-05-15 – 2020-05-18 (×9): 1000 mg via ORAL
  Filled 2020-05-15 (×9): qty 2

## 2020-05-15 MED ORDER — SODIUM CHLORIDE 0.9 % IV SOLN: INTRAVENOUS | Status: DC

## 2020-05-15 MED ORDER — METHOCARBAMOL 500 MG PO TABS
500.0000 mg | ORAL_TABLET | Freq: Four times a day (QID) | ORAL | Status: DC | PRN
  Filled 2020-05-15: qty 1

## 2020-05-15 MED ORDER — ONDANSETRON HCL 4 MG/2ML IJ SOLN
4.0000 mg | Freq: Once | INTRAMUSCULAR | Status: AC
  Administered 2020-05-15: 4 mg via INTRAVENOUS
  Filled 2020-05-15: qty 2

## 2020-05-15 MED ORDER — KETOROLAC TROMETHAMINE 30 MG/ML IJ SOLN
30.0000 mg | Freq: Four times a day (QID) | INTRAMUSCULAR | Status: DC | PRN
  Administered 2020-05-15: 30 mg via INTRAVENOUS
  Filled 2020-05-15: qty 1

## 2020-05-15 MED ORDER — ONDANSETRON 4 MG PO TBDP: 4.0000 mg | ORAL_TABLET | Freq: Four times a day (QID) | ORAL | Status: DC | PRN

## 2020-05-15 MED ORDER — SODIUM CHLORIDE 0.9 % IV BOLUS
1000.0000 mL | Freq: Once | INTRAVENOUS | Status: AC
  Administered 2020-05-15: 1000 mL via INTRAVENOUS

## 2020-05-15 MED ORDER — ONDANSETRON HCL 4 MG/2ML IJ SOLN
4.0000 mg | Freq: Four times a day (QID) | INTRAMUSCULAR | Status: DC | PRN
  Administered 2020-05-17: 4 mg via INTRAVENOUS
  Filled 2020-05-15: qty 2

## 2020-05-15 MED ORDER — PIPERACILLIN-TAZOBACTAM 3.375 G IVPB
3.3750 g | Freq: Three times a day (TID) | INTRAVENOUS | Status: DC
  Administered 2020-05-15 – 2020-05-18 (×8): 3.375 g via INTRAVENOUS
  Filled 2020-05-15 (×7): qty 50

## 2020-05-15 MED ORDER — MORPHINE SULFATE (PF) 4 MG/ML IV SOLN
4.0000 mg | Freq: Once | INTRAVENOUS | Status: AC
Start: 2020-05-15 — End: 2020-05-15
  Administered 2020-05-15: 4 mg via INTRAVENOUS
  Filled 2020-05-15: qty 1

## 2020-05-15 MED ORDER — INSULIN ASPART 100 UNIT/ML ~~LOC~~ SOLN
0.0000 [IU] | SUBCUTANEOUS | Status: DC
  Administered 2020-05-17 (×3): 2 [IU] via SUBCUTANEOUS
  Administered 2020-05-17: 3 [IU] via SUBCUTANEOUS
  Filled 2020-05-15 (×4): qty 1

## 2020-05-15 MED ORDER — SIMETHICONE 80 MG PO CHEW
40.0000 mg | CHEWABLE_TABLET | Freq: Four times a day (QID) | ORAL | Status: DC | PRN
  Filled 2020-05-15: qty 1

## 2020-05-15 MED ORDER — HYDROMORPHONE HCL 1 MG/ML IJ SOLN
0.5000 mg | INTRAMUSCULAR | Status: DC | PRN
  Administered 2020-05-16: 0.5 mg via INTRAVENOUS
  Filled 2020-05-15 (×2): qty 0.5

## 2020-05-15 MED ORDER — MORPHINE SULFATE (PF) 2 MG/ML IV SOLN
2.0000 mg | Freq: Once | INTRAVENOUS | Status: AC
Start: 2020-05-15 — End: 2020-05-15
  Administered 2020-05-15: 2 mg via INTRAVENOUS
  Filled 2020-05-15: qty 1

## 2020-05-15 NOTE — ED Provider Notes (Signed)
[facility] Emergency Department Provider Note   ____________________________________________   Event Date/Time   First MD Initiated Contact with Patient 05/15/20 1813     (approximate)  I have reviewed the triage vital signs and the nursing notes.   HISTORY  Chief Complaint Abdominal Pain    HPI Jacob Coleman is a 55 y.o. male past medical history of diabetes  Patient reports that about 2 weeks ago he had an episode last little while of pain in his mid to right upper abdomen.  It went away.  Then the last couple days it is recurred and seems to be getting worse located primarily in his right mid to right upper abdomen just below the rib cage  Not really nauseating but no appetite.  Pain is steadily increasing hard to describe but no pain on the left side of her lower abdomen.  No pain or difficulty with urination no groin pain or scrotal tenderness  Reports no previous abdominal surgeries.  No chest pain no shortness of breath no cough.  Wife had cholecystectomy about 2 weeks ago   Past Medical History:  Diagnosis Date  . Allergy   . Cancer [facility])    Moh's surgery on face  . Diabetes mellitus without complication ([place])   . Frozen shoulder    left  . Hypertension     Patient Active Problem List   Diagnosis Date Noted  . Acute calculous cholecystitis 05/15/2020  . Essential hypertension 06/29/2018  . Adhesive capsulitis of left shoulder 06/26/2017  . DDD (degenerative disc disease), lumbar 03/16/2014  . Low back pain 03/16/2014  . Lumbar radicular pain 03/16/2014  . Superior glenoid labrum lesion 02/10/2014  . Aftercare following surgery for neoplasm 01/20/2012  . Basal cell carcinoma 12/09/2011  . ED (erectile dysfunction) 10/08/2011  . Dyslipidemia 08/06/2011  . Diabetes mellitus without complication ([place]) 28/00/3491  . Insomnia 05/07/2011    No past surgical history on file.  Prior to Admission medications   Medication Sig  Start Date End Date Taking? Authorizing Provider  aspirin EC 81 MG tablet Take 1 tablet by mouth daily. 09/17/13   [provider]  atorvastatin (LIPITOR) 10 MG tablet Take 1 tablet (10 mg total) by mouth daily. 04/12/20   Juline Patch, MD  glimepiride (AMARYL) 2 MG tablet Take 1 tablet (2 mg total) by mouth daily before breakfast. Patient taking differently: Take 2 mg by mouth daily before breakfast. solum 06/29/18   Juline Patch, MD  lisinopril (ZESTRIL) 10 MG tablet Take 1 tablet (10 mg total) by mouth daily. 01/28/20   Juline Patch, MD  metFORMIN (GLUCOPHAGE) 1000 MG tablet Take 1 tablet (1,000 mg total) by mouth 2 (two) times daily. Patient taking differently: Take 1,000 mg by mouth 2 (two) times daily. solum 05/18/18   Juline Patch, MD    Allergies Patient has no known allergies.  Family History  Adopted: Yes    Social History Social History   Tobacco Use  . Smoking status: Current Every Day Smoker    Types: E-cigarettes  . Smokeless tobacco: Never Used  . Tobacco comment: wants info on quiting  Substance Use Topics  . Alcohol use: Yes  . Drug use: Never    Review of Systems Constitutional: No fever/chills Eyes: No visual changes. ENT: No sore throat. Cardiovascular: Denies chest pain. Respiratory: Denies shortness of breath. Gastrointestinal: See HPI Genitourinary: Negative for dysuria. Musculoskeletal: Negative for back pain. Skin: Negative for rash. Neurological: Negative for  headaches.    ____________________________________________   PHYSICAL EXAM:  VITAL SIGNS: ED Triage Vitals [05/15/20 1637]  Enc Vitals Group     BP (!) 137/94     Pulse Rate 87     Resp 20     Temp 98.3 F (36.8 C)     Temp Source Oral     SpO2 100 %     Weight 215 lb (97.5 kg)     Height 6' (1.829 m)     Head Circumference      Peak Flow      Pain Score 7     Pain Loc      Pain Edu?      Excl. in [place]?     Constitutional: Alert and oriented. Well  appearing and in no acute distress but is holding hand over mid to right upper abdomen appears in some pain. Eyes: Conjunctivae are normal. Head: Atraumatic. Nose: No congestion/rhinnorhea. Mouth/Throat: Mucous membranes are moist. Neck: No stridor.  Cardiovascular: Normal rate, regular rhythm. Grossly normal heart sounds.  Good peripheral circulation. Respiratory: Normal respiratory effort.  No retractions. Lungs CTAB. Gastrointestinal: Soft and nontender over the right lower quadrant and left side of the abdomen but the right upper abdomen and slight in the epigastrium he reports moderate tenderness but no rebound or guarding.  No peritonitis.. No distention. Musculoskeletal: No lower extremity tenderness nor edema. Neurologic:  Normal speech and language. No gross focal neurologic deficits are appreciated.  Skin:  Skin is warm, dry and intact. No rash noted. Psychiatric: Mood and affect are normal. Speech and behavior are normal.  ____________________________________________   LABS (all labs ordered are listed, but only abnormal results are displayed)  Labs Reviewed  COMPREHENSIVE METABOLIC PANEL - Abnormal; Notable for the following components:      Result Value   Sodium 134 (*)    CO2 19 (*)    Glucose, Bld 112 (*)    AST 13 (*)    Total Bilirubin 1.4 (*)    All other components within normal limits  CBC - Abnormal; Notable for the following components:   WBC 16.5 (*)    Hemoglobin 18.0 (*)    MCHC 36.2 (*)    All other components within normal limits  URINALYSIS, COMPLETE (UACMP) WITH MICROSCOPIC - Abnormal; Notable for the following components:   Color, Urine YELLOW (*)    APPearance CLEAR (*)    Glucose, UA >=500 (*)    Hgb urine dipstick MODERATE (*)    Ketones, ur 80 (*)    Protein, ur 30 (*)    All other components within normal limits  RESP PANEL BY RT-PCR (FLU A&B, COVID) ARPGX2  LIPASE, BLOOD  HIV ANTIBODY (ROUTINE TESTING W REFLEX)  COMPREHENSIVE METABOLIC  PANEL  MAGNESIUM  PHOSPHORUS  CBC  HEMOGLOBIN A1C   ____________________________________________  EKG  ED ECG REPORT I, Delman Kitten, the attending physician, personally viewed and interpreted this ECG.  Date: 05/15/2020 EKG Time: 1645 Rate: 85 Rhythm: normal sinus rhythm QRS Axis: normal Intervals: normal ST/T Wave abnormalities: normal Narrative Interpretation: no evidence of acute ischemia  ____________________________________________  RADIOLOGY  US ABDOMEN LIMITED RUQ (LIVER/GB)  Result Date: 05/15/2020 CLINICAL DATA:  Increased severity chronic right upper quadrant pain. EXAM: ULTRASOUND ABDOMEN LIMITED RIGHT UPPER QUADRANT COMPARISON:  None. FINDINGS: Gallbladder: Cholelithiasis measuring up to 2 cm. Biliary sludge. Trace pericholecystic fluid. Gallbladder wall thickening measuring up to 9 mm. Sonographic Murphy sign noted by sonographer. Common bile duct: Diameter: 4 mm Liver:  No focal lesion identified. Diffusely increased parenchymal echogenicity. Portal vein is patent on color Doppler imaging with normal direction of blood flow towards the liver. Other: None. IMPRESSION: 1. Cholelithiasis with sonographic findings suggestive of acute cholecystitis. 2. The echogenicity of the liver is increased. This is a nonspecific finding but is most commonly seen with fatty infiltration of the liver. There are no obvious focal liver lesions. Electronically Signed   By: Dahlia Bailiff MD   On: 05/15/2020 19:21    Imaging reviewed, concerning for cholelithiasis with findings concerning for possible acute cholecystitis.  Images personally viewed by me ____________________________________________   PROCEDURES  Procedure(s) performed: None  Procedures  Critical Care performed: No  ____________________________________________   INITIAL IMPRESSION / ASSESSMENT AND PLAN / ED COURSE  Pertinent labs & imaging results that were available during my care of the patient were reviewed by  me and considered in my medical decision making (see chart for details).   Differential diagnosis includes but is not limited to, abdominal perforation, aortic dissection, cholecystitis, appendicitis, diverticulitis, colitis, esophagitis/gastritis, kidney stone, pyelonephritis, urinary tract infection, aortic aneurysm. All are considered in decision and treatment plan. Based upon the patient's presentation and risk factors, most concerned about right upper quadrant pathology and will first evaluate with right upper quadrant ultrasound in particular looking for signs of cholecystitis.  Labs are not indicative of choledocholithiasis, and liver enzymes are reassuring.  He is not use alcohol.     Clinical Course as of 05/15/20 1956  Mon May 15, 2020  1832 Labs reviewed notable for leukocytosis.  In addition patient has very minimally elevated T bilirubin. [MQ]    Clinical Course User Index [MQ] Delman Kitten, MD   ----------------------------------------- 7:57 PM on 05/15/2020 -----------------------------------------  Patient reports moderate pain relief, would like a little additional morphine.  Understands nothing to eat or drink, concerns for a possibly acutely infected gallbladder.  Discussed with the patient is understanding agreeable with plan for admission.  Discussed with Dr. Celine Ahr, Dr. Celine Ahr will be admitting to her service  ____________________________________________   FINAL CLINICAL IMPRESSION(S) / ED DIAGNOSES  Final diagnoses:  Abdominal pain  Acute cholecystitis        Note:  This document was prepared using Dragon voice recognition software and may include unintentional dictation errors       Delman Kitten, MD 05/15/20 [phone]

## 2020-05-15 NOTE — ED Notes (Signed)
Per Jacqualine Code, MD patient to get medication after [place]. [place] at bedside at this time.

## 2020-05-15 NOTE — Consult Note (Signed)
Pharmacy Antibiotic Note  KHIRY PASQUARIELLO is a 55 y.o. male admitted on 05/15/2020 with abdominal pain. [place] suggestive of acute suggestive of acute cholecystitis. Pharmacy has been consulted for Zosyn dosing for intra-abdominal infection.   Plan: Zosyn 3.375g IV q8h (4 hour infusion).  Follow up for LOT/descalation   Height: 6' (182.9 cm) Weight: 97.5 kg (215 lb) IBW/kg (Calculated) : 77.6  Temp (24hrs), Avg:98.3 F (36.8 C), Min:98.3 F (36.8 C), Max:98.3 F (36.8 C)  Recent Labs  Lab 05/15/20 1647  WBC 16.5*  CREATININE 0.68    Estimated Creatinine Clearance: 126.3 mL/min (by C-G formula based on SCr of 0.68 mg/dL).    No Known Allergies  Antimicrobials this admission: 3/28 Zosyn >>    Dose adjustments this admission: n/a  Microbiology results:  Thank you for allowing pharmacy to be a part of this patient's care.  Dorothe Pea 05/15/2020 10:33 PM

## 2020-05-15 NOTE — Telephone Encounter (Signed)
Noted ER

## 2020-05-15 NOTE — ED Triage Notes (Signed)
Pt via POV from home. Pt c/o epigastric pain for about 3 weeks but has gotten worse the past 3 days. Denies NVD. Denies fevers. Denies CP/SOB. Denies any abdominal surgeries. Pt is A&Ox4 and NAD.

## 2020-05-15 NOTE — Telephone Encounter (Signed)
Pt reports upper abdominal pain; "Middle, right below rib cage." States onset 2 weeks ago, resolved, reoccurred 2 days ago. Rates at 7/10, constant. Reports sudden onset. Has taken Zantac, tylenol, ineffective.  Pt directed to ED, states will follow disposition. Care advise given, verbalizes understanding.   Reason for Disposition . [1] SEVERE pain (e.g., excruciating) AND [2] present > 1 hour  Answer Assessment - Initial Assessment Questions 1. LOCATION: "Where does it hurt?"      High up, below rib cage, middle 2. RADIATION: "Does the pain shoot anywhere else?" (e.g., chest, back)     No 3. ONSET: "When did the pain begin?" (Minutes, hours or days ago)      2 weeks ago, subsided, last few days worsened 4. SUDDEN: "Gradual or sudden onset?"     Sudden 5. PATTERN "Does the pain come and go, or is it constant?"    - If constant: "Is it getting better, staying the same, or worsening?"      (Note: Constant means the pain never goes away completely; most serious pain is constant and it progresses)     - If intermittent: "How long does it last?" "Do you have pain now?"     (Note: Intermittent means the pain goes away completely between bouts)     Constant 6. SEVERITY: "How bad is the pain?"  (e.g., Scale 1-10; mild, moderate, or severe)    - MILD (1-3): doesn't interfere with normal activities, abdomen soft and not tender to touch     - MODERATE (4-7): interferes with normal activities or awakens from sleep, tender to touch     - SEVERE (8-10): excruciating pain, doubled over, unable to do any normal activities       7/10 7. RECURRENT SYMPTOM: "Have you ever had this type of stomach pain before?" If Yes, ask: "When was the last time?" and "What happened that time?"      No 8. CAUSE: "What do you think is causing the stomach pain?"    unsure 9. RELIEVING/AGGRAVATING FACTORS: "What makes it better or worse?" (e.g., movement, antacids, bowel movement)     Zantac, tylenol, ineffective 10. OTHER  SYMPTOMS: "Has there been any vomiting, diarrhea, constipation, or urine problems?"      no  Protocols used: ABDOMINAL PAIN - MALE-A-AH

## 2020-05-16 ENCOUNTER — Encounter
Admission: EM | Disposition: A | Discharge status: Home / Self Care | Payer: Self-pay | Source: Home / Self Care | Attending: General Surgery

## 2020-05-16 ENCOUNTER — Inpatient Hospital Stay: Payer: BC Managed Care – PPO | Admitting: Anesthesiology

## 2020-05-16 DIAGNOSIS — K8000 Calculus of gallbladder with acute cholecystitis without obstruction: Principal | ICD-10-CM

## 2020-05-16 DIAGNOSIS — K77 Liver disorders in diseases classified elsewhere: Secondary | ICD-10-CM

## 2020-05-16 DIAGNOSIS — K810 Acute cholecystitis: Secondary | ICD-10-CM

## 2020-05-16 DIAGNOSIS — K82A1 Gangrene of gallbladder in cholecystitis: Secondary | ICD-10-CM

## 2020-05-16 HISTORY — PX: CHOLECYSTECTOMY: SHX55

## 2020-05-16 LAB — CBC
HCT: 48.6 % (ref 39.0–52.0)
Hemoglobin: 17.5 g/dL — ABNORMAL HIGH (ref 13.0–17.0)
MCH: 33.3 pg (ref 26.0–34.0)
MCHC: 36 g/dL (ref 30.0–36.0)
MCV: 92.6 fL (ref 80.0–100.0)
Platelets: 247 10*3/uL (ref 150–400)
RBC: 5.25 MIL/uL (ref 4.22–5.81)
RDW: 11.9 % (ref 11.5–15.5)
WBC: 16 10*3/uL — ABNORMAL HIGH (ref 4.0–10.5)
nRBC: 0 % (ref 0.0–0.2)

## 2020-05-16 LAB — PHOSPHORUS: Phosphorus: 3.3 mg/dL (ref 2.5–4.6)

## 2020-05-16 LAB — COMPREHENSIVE METABOLIC PANEL
ALT: 23 U/L (ref 0–44)
AST: 13 U/L — ABNORMAL LOW (ref 15–41)
Albumin: 4.2 g/dL (ref 3.5–5.0)
Alkaline Phosphatase: 77 U/L (ref 38–126)
Anion gap: 11 (ref 5–15)
BUN: 17 mg/dL (ref 6–20)
CO2: 20 mmol/L — ABNORMAL LOW (ref 22–32)
Calcium: 9 mg/dL (ref 8.9–10.3)
Chloride: 104 mmol/L (ref 98–111)
Creatinine, Ser: 0.81 mg/dL (ref 0.61–1.24)
GFR, Estimated: >60 mL/min (ref >60)
Glucose, Bld: 84 mg/dL (ref 70–99)
Potassium: 4.1 mmol/L (ref 3.5–5.1)
Sodium: 135 mmol/L (ref 135–145)
Total Bilirubin: 1.7 mg/dL — ABNORMAL HIGH (ref 0.3–1.2)
Total Protein: 7 g/dL (ref 6.5–8.1)

## 2020-05-16 LAB — HEMOGLOBIN A1C
Hgb A1c MFr Bld: 7.3 % — ABNORMAL HIGH (ref 4.8–5.6)
Mean Plasma Glucose: 162.81 mg/dL

## 2020-05-16 LAB — GLUCOSE, CAPILLARY
Glucose-Capillary: 95 mg/dL (ref 70–99)
Glucose-Capillary: 84 mg/dL (ref 70–99)
Glucose-Capillary: 77 mg/dL (ref 70–99)
Glucose-Capillary: 80 mg/dL (ref 70–99)
Glucose-Capillary: 119 mg/dL — ABNORMAL HIGH (ref 70–99)
Glucose-Capillary: 230 mg/dL — ABNORMAL HIGH (ref 70–99)
Glucose-Capillary: 207 mg/dL — ABNORMAL HIGH (ref 70–99)

## 2020-05-16 LAB — MAGNESIUM: Magnesium: 1.8 mg/dL (ref 1.7–2.4)

## 2020-05-16 LAB — HIV ANTIBODY (ROUTINE TESTING W REFLEX): HIV Screen 4th Generation wRfx: NONREACTIVE

## 2020-05-16 SURGERY — CHOLECYSTECTOMY, ROBOT-ASSISTED, LAPAROSCOPIC
Anesthesia: General

## 2020-05-16 MED ORDER — DEXTROSE 50 % IV SOLN: 12.5000 g | Freq: Once | INTRAVENOUS | Status: DC

## 2020-05-16 MED ORDER — FENTANYL CITRATE (PF) 100 MCG/2ML IJ SOLN
INTRAMUSCULAR | Status: AC
  Filled 2020-05-16: qty 2

## 2020-05-16 MED ORDER — LACTATED RINGERS IV SOLN: INTRAVENOUS | Status: DC | PRN

## 2020-05-16 MED ORDER — PIPERACILLIN-TAZOBACTAM 3.375 G IVPB
INTRAVENOUS | Status: AC
  Filled 2020-05-16: qty 50

## 2020-05-16 MED ORDER — FENTANYL CITRATE (PF) 100 MCG/2ML IJ SOLN
INTRAMUSCULAR | Status: DC | PRN
  Administered 2020-05-16 (×2): 50 ug via INTRAVENOUS
  Administered 2020-05-16: 100 ug via INTRAVENOUS
  Administered 2020-05-16 (×2): 50 ug via INTRAVENOUS

## 2020-05-16 MED ORDER — KETAMINE HCL 50 MG/5ML IJ SOSY
PREFILLED_SYRINGE | INTRAMUSCULAR | Status: AC
  Filled 2020-05-16: qty 5

## 2020-05-16 MED ORDER — SUGAMMADEX SODIUM 200 MG/2ML IV SOLN
INTRAVENOUS | Status: DC | PRN
  Administered 2020-05-16: 200 mg via INTRAVENOUS

## 2020-05-16 MED ORDER — LIDOCAINE-EPINEPHRINE 1 %-1:100000 IJ SOLN
INTRAMUSCULAR | Status: DC | PRN
  Administered 2020-05-16: 13 mL via INTRAMUSCULAR

## 2020-05-16 MED ORDER — LIDOCAINE-EPINEPHRINE 1 %-1:100000 IJ SOLN
INTRAMUSCULAR | Status: AC
  Filled 2020-05-16: qty 1

## 2020-05-16 MED ORDER — MIDAZOLAM HCL 2 MG/2ML IJ SOLN
INTRAMUSCULAR | Status: DC | PRN
  Administered 2020-05-16: 2 mg via INTRAVENOUS

## 2020-05-16 MED ORDER — SEVOFLURANE IN SOLN
RESPIRATORY_TRACT | Status: AC
  Filled 2020-05-16: qty 250

## 2020-05-16 MED ORDER — DEXTROSE 50 % IV SOLN
12.5000 g | INTRAVENOUS | Status: AC
  Administered 2020-05-16: 12.5 g via INTRAVENOUS
  Filled 2020-05-16: qty 50

## 2020-05-16 MED ORDER — BUPIVACAINE HCL (PF) 0.25 % IJ SOLN
INTRAMUSCULAR | Status: AC
  Filled 2020-05-16: qty 30

## 2020-05-16 MED ORDER — PHENYLEPHRINE HCL (PRESSORS) 10 MG/ML IV SOLN
INTRAVENOUS | Status: DC | PRN
  Administered 2020-05-16 (×4): 100 ug via INTRAVENOUS

## 2020-05-16 MED ORDER — ONDANSETRON HCL 4 MG/2ML IJ SOLN: 4.0000 mg | Freq: Once | INTRAMUSCULAR | Status: DC | PRN

## 2020-05-16 MED ORDER — DEXTROSE-NACL 5-0.9 % IV SOLN: INTRAVENOUS | Status: DC

## 2020-05-16 MED ORDER — INDOCYANINE GREEN 25 MG IV SOLR
7.5000 mg | Freq: Once | INTRAVENOUS | Status: DC
  Filled 2020-05-16: qty 10

## 2020-05-16 MED ORDER — SODIUM CHLORIDE 0.9 % IV SOLN: INTRAVENOUS | Status: DC | PRN

## 2020-05-16 MED ORDER — PROPOFOL 10 MG/ML IV BOLUS
INTRAVENOUS | Status: DC | PRN
  Administered 2020-05-16: 160 mg via INTRAVENOUS

## 2020-05-16 MED ORDER — ONDANSETRON HCL 4 MG/2ML IJ SOLN
INTRAMUSCULAR | Status: AC
  Filled 2020-05-16: qty 2

## 2020-05-16 MED ORDER — ROCURONIUM BROMIDE 100 MG/10ML IV SOLN
INTRAVENOUS | Status: DC | PRN
  Administered 2020-05-16: 20 mg via INTRAVENOUS
  Administered 2020-05-16: 50 mg via INTRAVENOUS
  Administered 2020-05-16 (×2): 20 mg via INTRAVENOUS
  Administered 2020-05-16 (×2): 10 mg via INTRAVENOUS

## 2020-05-16 MED ORDER — ONDANSETRON HCL 4 MG/2ML IJ SOLN
INTRAMUSCULAR | Status: DC | PRN
  Administered 2020-05-16: 4 mg via INTRAVENOUS

## 2020-05-16 MED ORDER — PROPOFOL 10 MG/ML IV BOLUS
INTRAVENOUS | Status: AC
  Filled 2020-05-16: qty 20

## 2020-05-16 MED ORDER — LIDOCAINE HCL (PF) 2 % IJ SOLN
INTRAMUSCULAR | Status: AC
  Filled 2020-05-16: qty 5

## 2020-05-16 MED ORDER — VISTASEAL 10 ML SINGLE DOSE KIT
PACK | CUTANEOUS | Status: AC
  Filled 2020-05-16: qty 10

## 2020-05-16 MED ORDER — INDOCYANINE GREEN 25 MG IV SOLR: 25.0000 mg | Freq: Once | INTRAVENOUS | Status: DC

## 2020-05-16 MED ORDER — KETAMINE HCL 10 MG/ML IJ SOLN
INTRAMUSCULAR | Status: DC | PRN
  Administered 2020-05-16 (×4): 10 mg via INTRAVENOUS

## 2020-05-16 MED ORDER — DEXMEDETOMIDINE (PRECEDEX) IN NS 20 MCG/5ML (4 MCG/ML) IV SYRINGE
PREFILLED_SYRINGE | INTRAVENOUS | Status: DC | PRN
  Administered 2020-05-16 (×5): 4 ug via INTRAVENOUS

## 2020-05-16 MED ORDER — LIDOCAINE HCL (CARDIAC) PF 100 MG/5ML IV SOSY
PREFILLED_SYRINGE | INTRAVENOUS | Status: DC | PRN
  Administered 2020-05-16: 100 mg via INTRAVENOUS

## 2020-05-16 MED ORDER — HEMOSTATIC AGENTS (NO CHARGE) OPTIME
TOPICAL | Status: DC | PRN
  Administered 2020-05-16 (×3): 1 via TOPICAL

## 2020-05-16 MED ORDER — FENTANYL CITRATE (PF) 100 MCG/2ML IJ SOLN
25.0000 ug | INTRAMUSCULAR | Status: DC | PRN
  Administered 2020-05-16 (×3): 25 ug via INTRAVENOUS

## 2020-05-16 MED ORDER — FENTANYL CITRATE (PF) 100 MCG/2ML IJ SOLN
INTRAMUSCULAR | Status: AC
  Administered 2020-05-16: 25 ug via INTRAVENOUS
  Filled 2020-05-16: qty 2

## 2020-05-16 MED ORDER — MIDAZOLAM HCL 2 MG/2ML IJ SOLN
INTRAMUSCULAR | Status: AC
  Filled 2020-05-16: qty 2

## 2020-05-16 MED ORDER — INDOCYANINE GREEN 25 MG IV SOLR
INTRAVENOUS | Status: DC | PRN
  Administered 2020-05-16: 7.5 mg via INTRAVENOUS

## 2020-05-16 MED ORDER — VISTASEAL 10 ML SINGLE DOSE KIT
PACK | CUTANEOUS | Status: DC | PRN
  Administered 2020-05-16: 10 mL via TOPICAL

## 2020-05-16 SURGICAL SUPPLY — 62 items
APPLICATOR VISTASEAL 35 (MISCELLANEOUS) ×2 IMPLANT
BAG PRESSURE INFUS OVAL 1000CC (MISCELLANEOUS) ×4 IMPLANT
BLADE SURG SZ11 CARB STEEL (BLADE) ×2 IMPLANT
CANNULA REDUC XI 12-8 STAPL (CANNULA) ×1
CANNULA REDUCER 12-8 DVNC XI (CANNULA) ×1 IMPLANT
CHLORAPREP W/TINT 26 (MISCELLANEOUS) ×2 IMPLANT
CLIP VESOLOCK MED LG 6/CT (CLIP) ×4 IMPLANT
COVER TIP SHEARS 8 DVNC (MISCELLANEOUS) ×1 IMPLANT
COVER TIP SHEARS 8MM DA VINCI (MISCELLANEOUS) ×1
COVER WAND RF STERILE (DRAPES) ×2 IMPLANT
DECANTER SPIKE VIAL GLASS SM (MISCELLANEOUS) ×4 IMPLANT
DEFOGGER SCOPE WARMER CLEARIFY (MISCELLANEOUS) ×2 IMPLANT
DERMABOND ADVANCED (GAUZE/BANDAGES/DRESSINGS) ×1
DERMABOND ADVANCED .7 DNX12 (GAUZE/BANDAGES/DRESSINGS) ×1 IMPLANT
DRAPE ARM DVNC X/XI (DISPOSABLE) ×4 IMPLANT
DRAPE COLUMN DVNC XI (DISPOSABLE) ×1 IMPLANT
DRAPE DA VINCI XI ARM (DISPOSABLE) ×4
DRAPE DA VINCI XI COLUMN (DISPOSABLE) ×1
ELECT CAUTERY BLADE TIP 2.5 (TIP) ×2
ELECT REM PT RETURN 9FT ADLT (ELECTROSURGICAL) ×2
ELECTRODE CAUTERY BLDE TIP 2.5 (TIP) ×1 IMPLANT
ELECTRODE REM PT RTRN 9FT ADLT (ELECTROSURGICAL) ×1 IMPLANT
GAUZE 4X4 16PLY RFD (DISPOSABLE) ×2 IMPLANT
GLOVE SURG ENC MOIS LTX SZ6.5 (GLOVE) ×4 IMPLANT
GLOVE SURG UNDER LTX SZ7 (GLOVE) ×4 IMPLANT
GOWN STRL REUS W/ TWL LRG LVL3 (GOWN DISPOSABLE) ×3 IMPLANT
GOWN STRL REUS W/TWL LRG LVL3 (GOWN DISPOSABLE) ×3
GRASPER SUT TROCAR 14GX15 (MISCELLANEOUS) IMPLANT
HEMOSTAT SURGICEL 2X14 (HEMOSTASIS) ×2 IMPLANT
HEMOSTAT SURGICEL 2X3 (HEMOSTASIS) ×2 IMPLANT
IRRIGATOR SUCT 8 DISP DVNC XI (IRRIGATION / IRRIGATOR) ×1 IMPLANT
IRRIGATOR SUCTION 8MM XI DISP (IRRIGATION / IRRIGATOR) ×1
IV NS 1000ML (IV SOLUTION) ×2
IV NS 1000ML BAXH (IV SOLUTION) ×2 IMPLANT
KIT PINK PAD W/HEAD ARE REST (MISCELLANEOUS) ×2
KIT PINK PAD W/HEAD ARM REST (MISCELLANEOUS) ×1 IMPLANT
LABEL OR SOLS (LABEL) ×2 IMPLANT
MANIFOLD NEPTUNE II (INSTRUMENTS) ×2 IMPLANT
NEEDLE HYPO 22GX1.5 SAFETY (NEEDLE) ×2 IMPLANT
NEEDLE INSUFFLATION 14GA 120MM (NEEDLE) IMPLANT
NS IRRIG 500ML POUR BTL (IV SOLUTION) ×2 IMPLANT
OBTURATOR OPTICAL STANDARD 8MM (TROCAR) ×1
OBTURATOR OPTICAL STND 8 DVNC (TROCAR) ×1
OBTURATOR OPTICALSTD 8 DVNC (TROCAR) ×1 IMPLANT
PACK LAP CHOLECYSTECTOMY (MISCELLANEOUS) ×2 IMPLANT
PENCIL ELECTRO HAND CTR (MISCELLANEOUS) ×2 IMPLANT
POUCH SPECIMEN RETRIEVAL 10MM (ENDOMECHANICALS) ×2 IMPLANT
SEAL CANN UNIV 5-8 DVNC XI (MISCELLANEOUS) ×3 IMPLANT
SEAL XI 5MM-8MM UNIVERSAL (MISCELLANEOUS) ×3
SET TUBE SMOKE EVAC HIGH FLOW (TUBING) ×2 IMPLANT
SOLUTION ELECTROLUBE (MISCELLANEOUS) ×2 IMPLANT
SPONGE LAP 18X18 RF (DISPOSABLE) ×2 IMPLANT
STAPLER CANNULA SEAL DVNC XI (STAPLE) ×1 IMPLANT
STAPLER CANNULA SEAL XI (STAPLE) ×1
STRIP CLOSURE SKIN 1/2X4 (GAUZE/BANDAGES/DRESSINGS) ×2 IMPLANT
SUT MNCRL 4-0 (SUTURE) ×1
SUT MNCRL 4-0 27XMFL (SUTURE) ×1
SUT VIC AB 3-0 SH 27 (SUTURE) ×1
SUT VIC AB 3-0 SH 27X BRD (SUTURE) ×1 IMPLANT
SUT VICRYL 0 AB UR-6 (SUTURE) ×4 IMPLANT
SUTURE MNCRL 4-0 27XMF (SUTURE) ×1 IMPLANT
TROCAR XCEL NON-BLD 5MMX100MML (ENDOMECHANICALS) IMPLANT

## 2020-05-16 NOTE — H&P (Addendum)
[facility] SURGICAL HISTORY & PHYSICAL (cpt [phone])  HISTORY OF PRESENT ILLNESS (HPI):  55 y.o. male presented to [facility] ED yesterday for abdominal pain. Patient reports around a 24 hour history of RUQ abdominal pain which has been constant and progressively getting worse. He described this as a sharp and aching pain. He had a similar episode about 2 weeks ago but this spontaneously resolved. He endorsed associated nausea and decreased appetite with the pain. No fever, chills, cough, CP, SOB, emesis, or bowel changes. No previous intra-abdominal surgeries. Work up in the ED revealed a leukocytosis to 16.5K and RUQ [place] was concerning for cholecystitis.   General surgery is consulted by emergency medicine physician Dr Delman Kitten, MD for evaluation and management of acute cholecystitis.   PAST MEDICAL HISTORY (PMH):  Past Medical History:  Diagnosis Date   Allergy    Cancer [facility])    Moh's surgery on face   Diabetes mellitus without complication ([place])    Frozen shoulder    left   Hypertension     Reviewed. Otherwise negative.   PAST SURGICAL HISTORY ([place]):  No past surgical history on file.  Reviewed. Otherwise negative.   MEDICATIONS:  Prior to Admission medications   Medication Sig Start Date End Date Taking? Authorizing Provider  aspirin EC 81 MG tablet Take 1 tablet by mouth daily. 09/17/13   [provider]  atorvastatin (LIPITOR) 10 MG tablet Take 1 tablet (10 mg total) by mouth daily. 04/12/20   Juline Patch, MD  glimepiride (AMARYL) 2 MG tablet Take 1 tablet (2 mg total) by mouth daily before breakfast. Patient taking differently: Take 2 mg by mouth daily before breakfast. solum 06/29/18   Juline Patch, MD  lisinopril (ZESTRIL) 10 MG tablet Take 1 tablet (10 mg total) by mouth daily. 01/28/20   Juline Patch, MD  metFORMIN (GLUCOPHAGE) 1000 MG tablet Take 1 tablet (1,000 mg total) by mouth 2 (two) times daily. Patient taking differently: Take 1,000 mg  by mouth 2 (two) times daily. solum 05/18/18   Juline Patch, MD     ALLERGIES:  No Known Allergies   SOCIAL HISTORY:  Social History   Socioeconomic History   Marital status: Married    Spouse name: Not on file   Number of children: Not on file   Years of education: Not on file   Highest education level: Not on file  Occupational History   Not on file  Tobacco Use   Smoking status: Current Every Day Smoker    Types: E-cigarettes   Smokeless tobacco: Never Used   Tobacco comment: wants info on quiting  Substance and Sexual Activity   Alcohol use: Yes   Drug use: Never   Sexual activity: Yes  Other Topics Concern   Not on file  Social History Narrative   Not on file   Social Determinants of Health   Financial Resource Strain: Not on file  Food Insecurity: Not on file  Transportation Needs: Not on file  Physical Activity: Not on file  Stress: Not on file  Social Connections: Not on file  Intimate Partner Violence: Not on file     FAMILY HISTORY:  Family History  Adopted: Yes    Otherwise negative.   REVIEW OF SYSTEMS:  Review of Systems  Constitutional: Negative for chills and fever.  HENT: Negative for congestion and sore throat.   Respiratory: Negative for cough and shortness of breath.   Cardiovascular: Negative for chest pain and palpitations.  Gastrointestinal: Positive for abdominal pain. Negative for constipation, diarrhea, nausea and vomiting.  Genitourinary: Negative for dysuria and urgency.  All other systems reviewed and are negative.   VITAL SIGNS:  Temp:  [98.3 F (36.8 C)-98.5 F (36.9 C)] 98.4 F (36.9 C) (03/29 0755) Pulse Rate:  [77-87] 85 (03/29 0755) Resp:  [14-20] 16 (03/29 0755) BP: (133-162)/(51-95) 133/78 (03/29 0755) SpO2:  [95 %-100 %] 97 % (03/29 0755) Weight:  [94.8 kg-97.5 kg] 94.8 kg (03/28 2315)     Height: 6' (182.9 cm) Weight: 94.8 kg BMI (Calculated): 28.34   PHYSICAL EXAM:  Physical Exam Vitals and nursing note  reviewed.  Constitutional:      General: He is not in acute distress.    Appearance: He is well-developed and normal weight. He is not ill-appearing.  HENT:     Head: Normocephalic and atraumatic.  Eyes:     General: No scleral icterus.    Extraocular Movements: Extraocular movements intact.  Cardiovascular:     Rate and Rhythm: Normal rate and regular rhythm.     Heart sounds: Normal heart sounds. No murmur heard.   Pulmonary:     Effort: Pulmonary effort is normal. No respiratory distress.  Abdominal:     General: There is no distension.     Palpations: Abdomen is soft.     Tenderness: There is abdominal tenderness in the right upper quadrant and epigastric area. There is no guarding or rebound.  Genitourinary:    Comments: Deferred Skin:    General: Skin is warm.     Coloration: Skin is not jaundiced or pale.  Neurological:     General: No focal deficit present.     Mental Status: He is alert and oriented to person, place, and time.  Psychiatric:        Mood and Affect: Mood normal.        Behavior: Behavior normal.     INTAKE/OUTPUT:  This shift: No intake/output data recorded.  Last 2 shifts: @IOLAST2SHIFTS @  Labs:  CBC Latest Ref Rng & Units 05/16/2020 05/15/2020  WBC 4.0 - 10.5 K/uL 16.0(H) 16.5(H)  Hemoglobin 13.0 - 17.0 g/dL 17.5(H) 18.0(H)  Hematocrit 39.0 - 52.0 % 48.6 49.7  Platelets 150 - 400 K/uL 247 261   CMP Latest Ref Rng & Units 05/16/2020 05/15/2020 01/28/2020  Glucose 70 - 99 mg/dL 84 112(H) 166(H)  BUN 6 - 20 mg/dL 17 14 18   Creatinine 0.61 - 1.24 mg/dL 0.81 0.68 0.96  Sodium 135 - 145 mmol/L 135 134(L) 138  Potassium 3.5 - 5.1 mmol/L 4.1 3.9 4.5  Chloride 98 - 111 mmol/L 104 102 97  CO2 22 - 32 mmol/L 20(L) 19(L) 22  Calcium 8.9 - 10.3 mg/dL 9.0 9.6 10.3(H)  Total Protein 6.5 - 8.1 g/dL 7.0 7.6 7.4  Total Bilirubin 0.3 - 1.2 mg/dL 1.7(H) 1.4(H) 0.4  Alkaline Phos 38 - 126 U/L 77 84 116  AST 15 - 41 U/L 13(L) 13(L) 18  ALT 0 - 44 U/L 23 28 36     Imaging studies:   RUQ [place] (05/15/2020) personally reviewed showing cholelithiasis and evidence of cholecystitis, and radiologist report reviewed below:  IMPRESSION: 1. Cholelithiasis with sonographic findings suggestive of acute cholecystitis. 2. The echogenicity of the liver is increased. This is a nonspecific finding but is most commonly seen with fatty infiltration of the liver. There are no obvious focal liver lesions.   Assessment/Plan: (ICD-10's: K81.0) 55 y.o. male with epigastric and RUQ abdominal pain concerning for acute  calculous cholecystitis   - Will plan on robotic assisted laparoscopic cholecystectomy this afternoon with Dr Celine Ahr pending OR/Anesthesia availability  - All risks, benefits, and alternatives to above procedure(s) were discussed with the patient, all of his questions were answered to his expressed satisfaction, patient expresses he wishes to proceed, and informed consent was obtained.  - NPO + IVF Resuscitation  - IV ABx (Zosyn)   - Monitor abdominal examination  - Pain control prn; antiemetics prn   - DVT prophylaxis; hold for OR  All of the above findings and recommendations were discussed with the patient, and all of his questions were answered to his expressed satisfaction.  -- Edison Simon, PA-C [facility] 05/16/2020, 8:11 AM [phone] M-F: 7am - 4pm  I saw and evaluated the patient.  I agree with the above documentation, exam, and plan, which I have edited where appropriate. Fredirick Maudlin  1:15 PM

## 2020-05-16 NOTE — Anesthesia Procedure Notes (Signed)
Procedure Name: Intubation Date/Time: 05/16/2020 7:39 PM Performed by: Lendon Colonel, CRNA Pre-anesthesia Checklist: Patient identified, Patient being monitored, Timeout performed, Emergency Drugs available and Suction available Patient Re-evaluated:Patient Re-evaluated prior to induction Oxygen Delivery Method: Circle system utilized Preoxygenation: Pre-oxygenation with 100% oxygen Induction Type: IV induction Ventilation: Mask ventilation without difficulty Laryngoscope Size: McGraph and 4 Grade View: Grade I Tube type: Oral Tube size: 7.5 mm Number of attempts: 1 Airway Equipment and Method: Stylet Placement Confirmation: ETT inserted through vocal cords under direct vision,  positive ETCO2 and breath sounds checked- equal and bilateral Secured at: 21 cm Tube secured with: Tape Dental Injury: Teeth and Oropharynx as per pre-operative assessment

## 2020-05-16 NOTE — Anesthesia Postprocedure Evaluation (Signed)
Anesthesia Post Note  Patient: Jacob Coleman  Procedure(s) Performed: XI ROBOTIC ASSISTED LAPAROSCOPIC CHOLECYSTECTOMY (N/A )  Patient location during evaluation: PACU Anesthesia Type: General Level of consciousness: sedated Pain management: pain level controlled Vital Signs Assessment: post-procedure vital signs reviewed and stable Respiratory status: spontaneous breathing and respiratory function stable Cardiovascular status: stable Anesthetic complications: no   No complications documented.   Last Vitals:  Vitals:   05/16/20 2329 05/16/20 2330  BP:  121/66  Pulse: 88 87  Resp: 13 11  Temp:    SpO2: 99% 100%    Last Pain:  Vitals:   05/16/20 1142  TempSrc: Oral  PainSc:                  Allix Blomquist K

## 2020-05-16 NOTE — Anesthesia Preprocedure Evaluation (Signed)
Anesthesia Evaluation  Patient identified by MRN, date of birth, ID band Patient awake    Reviewed: Allergy & Precautions, NPO status , Patient's Chart, lab work & pertinent test results  History of Anesthesia Complications Negative for: history of anesthetic complications  Airway Mallampati: II       Dental   Pulmonary neg sleep apnea, neg COPD, Current Smoker,           Cardiovascular hypertension, Pt. on medications (-) Past MI and (-) CHF (-) dysrhythmias (-) Valvular Problems/Murmurs     Neuro/Psych neg Seizures    GI/Hepatic Neg liver ROS, neg GERD  ,  Endo/Other  diabetes, Type 2, Oral Hypoglycemic Agents  Renal/GU      Musculoskeletal   Abdominal   Peds  Hematology   Anesthesia Other Findings   Reproductive/Obstetrics                             Anesthesia Physical Anesthesia Plan  ASA: III and emergent  Anesthesia Plan: General   Post-op Pain Management:    Induction: Intravenous  PONV Risk Score and Plan: 1 and Ondansetron  Airway Management Planned: Oral ETT  Additional Equipment:   Intra-op Plan:   Post-operative Plan:   Informed Consent: I have reviewed the patients History and Physical, chart, labs and discussed the procedure including the risks, benefits and alternatives for the proposed anesthesia with the patient or authorized representative who has indicated his/her understanding and acceptance.       Plan Discussed with:   Anesthesia Plan Comments:         Anesthesia Quick Evaluation

## 2020-05-16 NOTE — Transfer of Care (Signed)
Immediate Anesthesia Transfer of Care Note  Patient: Jacob Coleman  Procedure(s) Performed: XI ROBOTIC ASSISTED LAPAROSCOPIC CHOLECYSTECTOMY (N/A )  Patient Location: PACU  Anesthesia Type:General  Level of Consciousness: awake and patient cooperative  Airway & Oxygen Therapy: Patient Spontanous Breathing and Patient connected to face mask oxygen  Post-op Assessment: Report given to RN and Post -op Vital signs reviewed and stable  Post vital signs: Reviewed and stable  Last Vitals:  Vitals Value Taken Time  BP 121/66 05/16/20 2330  Temp 36.2 C 05/16/20 2328  Pulse 81 05/16/20 2333  Resp 14 05/16/20 2332  SpO2 100 % 05/16/20 2333  Vitals shown include unvalidated device data.  Last Pain:  Vitals:   05/16/20 1142  TempSrc: Oral  PainSc:       Patients Stated Pain Goal: 3 (32/02/33 4356)  Complications: No complications documented.

## 2020-05-16 NOTE — Op Note (Signed)
Operative note  Pre-operative Diagnosis: Acute cholecystitis  Post-operative Diagnosis: Same  Procedure: Robot assisted laparoscopic cholecystectomy; modifier 22 applied due to the significant difficulty of the procedure  Surgeon: Fredirick Maudlin, MD  Anesthesia: GETA  Findings: The gallbladder was almost entirely intrahepatic and incredibly inflamed.  It was friable and the tissues bled with minimal manipulation.  Due to the thickened wall as well as the degree of embedment within the hepatic parenchyma it was difficult to discern the plane between the liver and the gallbladder.  At least one large stone was appreciated within the gallbladder.  Normal biliary and vascular anatomy.  Estimated Blood Loss: 25 cc       Specimens: Gallbladder           Complications: none immediately apparent  Procedure In Detail: The patient was seen again in the holding room. The benefits, complications, treatment options, and expected outcomes were discussed with the patient. The risks of bleeding, infection, recurrence of symptoms, failure to resolve symptoms, bile duct damage, bile duct leak, retained common bile duct stone, bowel injury, any of which could require further surgery and/or ERCP, stent, or papillotomy were reviewed with the patient. The likelihood of improving the patient's symptoms with return to their baseline status is good.  The patient and/or family concurred with the proposed plan, giving informed consent.  The patient was taken to operating room, identified and the procedure verified as laparoscopic cholecystectomy.  A time out was held and the above information confirmed.  Prior to the induction of general anesthesia, antibiotic prophylaxis was administered. VTE prophylaxis was in place. General endotracheal anesthesia was then administered and tolerated well. After the induction, the abdomen was prepped with Chloraprep and draped in the sterile fashion. The patient was positioned in  the supine position.  The Veress needle was used to access the abdomen.  Intra-abdominal placement was confirmed via 2 clicks and the saline drop test.  Pneumoperitoneum was then created with CO2 and tolerated well without any adverse changes in the patient's vital signs.  An 8 mm robotic trocar was placed using Optiview technique.  A 12 mm robotic trocar along with 2 additional 8-mm robotic trocars were placed under direct vision.  All skin incisions were infiltrated with a local anesthetic agent before making the incision and placing the trocars.   The patient was positioned in 15 degrees of reverse Trendelenburg and tilted 10 degrees to the left.  The robot was brought to the surgical field and docked in the standard fashion.  We made sure all the instrumentation was kept in direct view at all times and that there were no collision between the arms.  I scrubbed out and went to the console.  The gallbladder was identified.  It was deeply intrahepatic and markedly inflamed.  Due to the thickness of the gallbladder wall and degree of inflammation, the fundus was not able to be grasped; the gallbladder and liver were simply pushed cranially with the prograsp instrument.  I attempted to grasp the infundibulum to retract it laterally, but the tissues were friable and they bled with minimal manipulation.  Ultimately, I used the fenestrated bipolar forceps to push the infundibulum laterally.  Over the course of the next several hours, dissection was carried out using a combination of the monopolar scissors and the suction irrigator to tease the tissues at the triangle of Dalton free from each other.  The cystic duct node was markedly enlarged and blood freely.  At one point, I initiated a top-down  approach to try and gain a more play with the gallbladder as well as a better edge to grasp.  I alternated between top-down and additional exposure at the infundibulum.  ICG cholangiography was vital in the process of  identifying the cystic duct and common bile duct.  Finally, the peritoneum was able to be completely divided to obtain the critical view of safety. An extended critical view of the cystic duct and cystic artery was obtained.  The cystic duct was clearly identified and bluntly dissected away from the surrounding tissues, as was the cystic artery.  Using ICG cholangiography we visualized the cystic duct and confirmed that there was no aberrant biliary ductal anatomy nor any evidence of bile duct injury.  Both cystic duct and cystic artery were clipped and divided. The gallbladder was taken from the gallbladder fossa in a retrograde fashion with the electrocautery.  Once again, due to the degree that the gallbladder was embedded within the liver tissue, as well as the inflammation and thickness of the gallbladder wall, it was quite difficult to discern the planes.  There was modest bleeding from the hepatic bed and I suspect there may be some gallbladder wall that was left behind, however this was quite difficult to discern in this complicated and challenging procedure.  Ultimately, the gallbladder was completely freed.  I then proceeded to thoroughly cauterize the liver bed to obtain hemostasis.  Additional topical hemostatic agents, specifically Surgicel and Vistaseal were applied to the liver bed.. Inspection of the right upper quadrant was performed. No bleeding, bile duct injury or leak, or bowel injury was noted.  The gallbladder was then placed in an Endopouch bag. The robotic instruments were removed and robotic arms were undocked in the standard fashion.    I scrubbed back in.  The gallbladder was removed via the 12 mm trocar site.  Due to the size of the gallbladder and the large stone contained within, the incision had to be enlarged significantly to allow passage of the specimen.  The fascial edges were then grasped with Kocher clamps and the fascia was closed with a running 0 Vicryl suture.  The  remaining 8 mm ports were removed and pneumoperitoneum was released.  Each port site was closed with deep dermal 3-0 Vicryl.  4-0 subcuticular Monocryl was used to close the skin. Dermabond was applied, followed by Steri-Strips.  The patient was then awakened, extubated, and taken to the postanesthesia recovery unit in stable condition.   Sponge, lap, and needle counts were reported to be correct number at closure and at the conclusion of the case.          Fredirick Maudlin, MD FACS

## 2020-05-17 ENCOUNTER — Encounter: Payer: Self-pay | Admitting: General Surgery

## 2020-05-17 ENCOUNTER — Other Ambulatory Visit: Payer: Self-pay

## 2020-05-17 LAB — PHOSPHORUS: Phosphorus: 2.9 mg/dL (ref 2.5–4.6)

## 2020-05-17 LAB — COMPREHENSIVE METABOLIC PANEL
ALT: 86 U/L — ABNORMAL HIGH (ref 0–44)
AST: 73 U/L — ABNORMAL HIGH (ref 15–41)
Albumin: 3.7 g/dL (ref 3.5–5.0)
Alkaline Phosphatase: 67 U/L (ref 38–126)
Anion gap: 12 (ref 5–15)
BUN: 18 mg/dL (ref 6–20)
CO2: 14 mmol/L — ABNORMAL LOW (ref 22–32)
Calcium: 8.7 mg/dL — ABNORMAL LOW (ref 8.9–10.3)
Chloride: 107 mmol/L (ref 98–111)
Creatinine, Ser: 0.97 mg/dL (ref 0.61–1.24)
GFR, Estimated: >60 mL/min (ref >60)
Glucose, Bld: 118 mg/dL — ABNORMAL HIGH (ref 70–99)
Potassium: 4.4 mmol/L (ref 3.5–5.1)
Sodium: 133 mmol/L — ABNORMAL LOW (ref 135–145)
Total Bilirubin: 1.5 mg/dL — ABNORMAL HIGH (ref 0.3–1.2)
Total Protein: 6.9 g/dL (ref 6.5–8.1)

## 2020-05-17 LAB — CBC
HCT: 46 % (ref 39.0–52.0)
Hemoglobin: 16.3 g/dL (ref 13.0–17.0)
MCH: 33.1 pg (ref 26.0–34.0)
MCHC: 35.4 g/dL (ref 30.0–36.0)
MCV: 93.5 fL (ref 80.0–100.0)
Platelets: 228 10*3/uL (ref 150–400)
RBC: 4.92 MIL/uL (ref 4.22–5.81)
RDW: 11.9 % (ref 11.5–15.5)
WBC: 16.2 10*3/uL — ABNORMAL HIGH (ref 4.0–10.5)
nRBC: 0 % (ref 0.0–0.2)

## 2020-05-17 LAB — GLUCOSE, CAPILLARY
Glucose-Capillary: 87 mg/dL (ref 70–99)
Glucose-Capillary: 147 mg/dL — ABNORMAL HIGH (ref 70–99)
Glucose-Capillary: 126 mg/dL — ABNORMAL HIGH (ref 70–99)
Glucose-Capillary: 119 mg/dL — ABNORMAL HIGH (ref 70–99)
Glucose-Capillary: 174 mg/dL — ABNORMAL HIGH (ref 70–99)
Glucose-Capillary: 129 mg/dL — ABNORMAL HIGH (ref 70–99)

## 2020-05-17 LAB — MAGNESIUM: Magnesium: 1.9 mg/dL (ref 1.7–2.4)

## 2020-05-17 MED ORDER — PREGABALIN 50 MG PO CAPS
50.0000 mg | ORAL_CAPSULE | Freq: Three times a day (TID) | ORAL | Status: DC
  Administered 2020-05-17 – 2020-05-18 (×4): 50 mg via ORAL
  Filled 2020-05-17 (×4): qty 1

## 2020-05-17 MED ORDER — HYDROMORPHONE HCL 1 MG/ML IJ SOLN
0.5000 mg | INTRAMUSCULAR | Status: DC | PRN
  Administered 2020-05-17: 1 mg via INTRAVENOUS
  Filled 2020-05-17: qty 1

## 2020-05-17 MED ORDER — OXYCODONE HCL 5 MG PO TABS
5.0000 mg | ORAL_TABLET | ORAL | Status: DC | PRN
  Administered 2020-05-17 (×2): 5 mg via ORAL
  Filled 2020-05-17 (×2): qty 1

## 2020-05-17 MED ORDER — POLYETHYLENE GLYCOL 3350 17 G PO PACK
17.0000 g | PACK | Freq: Every day | ORAL | Status: DC
  Administered 2020-05-17 – 2020-05-18 (×2): 17 g via ORAL
  Filled 2020-05-17 (×2): qty 1

## 2020-05-17 MED ORDER — OXYCODONE HCL 5 MG PO TABS: 5.0000 mg | ORAL_TABLET | ORAL | Status: DC | PRN

## 2020-05-17 NOTE — Progress Notes (Addendum)
[employer] Hospital Day(s): 2.   Post op day(s): 1 Day Post-Op.   Interval History:  Patient seen and examined No acute events or new complaints overnight.  Patient reports he is having significant abdominal pain over the largest of the 4 laparoscopic incisions, worse with any movement No pain elsewhere No fever, chills, nausea, emesis   Vital signs in last 24 hours: [min-max] current  Temp:  [97.1 F (36.2 C)-99.2 F (37.3 C)] 98.6 F (37 C) (03/30 0859) Pulse Rate:  [78-90] 90 (03/30 0859) Resp:  [11-20] 18 (03/30 0859) BP: (89-144)/(66-88) 132/74 (03/30 0859) SpO2:  [95 %-100 %] 97 % (03/30 0859)     Height: 6' (182.9 cm) Weight: 94.8 kg BMI (Calculated): 28.34   Intake/Output last 2 shifts:  03/29 0701 - 03/30 0700 In: 2370.4 [P.O.:30; I.V.:2290.4; IV Piggyback:50.1] Out: 1400 [Urine:1400]   Physical Exam:  Constitutional: alert, cooperative and no distress  Respiratory: breathing non-labored at rest  Cardiovascular: regular rate and sinus rhythm  Gastrointestinal: Soft, significant incisional tenderness, non-distended Integumentary: Laparoscopic incisions are CDI with dermabond, no erythema or drainage   Labs:  CBC Latest Ref Rng & Units 05/17/2020 05/16/2020 05/15/2020  WBC 4.0 - 10.5 K/uL 16.2(H) 16.0(H) 16.5(H)  Hemoglobin 13.0 - 17.0 g/dL 16.3 17.5(H) 18.0(H)  Hematocrit 39.0 - 52.0 % 46.0 48.6 49.7  Platelets 150 - 400 K/uL 228 247 261   CMP Latest Ref Rng & Units 05/17/2020 05/16/2020 05/15/2020  Glucose 70 - 99 mg/dL 118(H) 84 112(H)  BUN 6 - 20 mg/dL 18 17 14   Creatinine 0.61 - 1.24 mg/dL 0.97 0.81 0.68  Sodium 135 - 145 mmol/L 133(L) 135 134(L)  Potassium 3.5 - 5.1 mmol/L 4.4 4.1 3.9  Chloride 98 - 111 mmol/L 107 104 102  CO2 22 - 32 mmol/L 14(L) 20(L) 19(L)  Calcium 8.9 - 10.3 mg/dL 8.7(L) 9.0 9.6  Total Protein 6.5 - 8.1 g/dL 6.9 7.0 7.6  Total Bilirubin 0.3 - 1.2 mg/dL 1.5(H) 1.7(H) 1.4(H)  Alkaline Phos 38 -  126 U/L 67 77 84  AST 15 - 41 U/L 73(H) 13(L) 13(L)  ALT 0 - 44 U/L 86(H) 23 28    Imaging studies: No new pertinent imaging studies   Assessment/Plan:  55 y.o. male with significant incisional tenderness 1 Day Post-Op s/p robotic assisted laparoscopic cholecystectomy for acute cholecystitis   - I made adjustments to his pain regimen given his greater than expected incisional pain this morning; there was a very large gallstone in the gallbladder that required significant stretching of the muscle layer in order to extract the specimen.  - Continue CLD for now + IVF resuscitation   - Monitor abdominal examination   - antiemetics prn    - Not quite ready for discharge   All of the above findings and recommendations were discussed with the patient, and the medical team, and all of patient's questions were answered to his expressed satisfaction.  -- Edison Simon, PA-C  [facility] 05/17/2020, 9:02 AM [phone] M-F: 7am - 4pm  I saw and evaluated the patient.  I agree with the above documentation, exam, and plan, which I have edited where appropriate. Fredirick Maudlin  12:28 PM

## 2020-05-18 LAB — GLUCOSE, CAPILLARY
Glucose-Capillary: 101 mg/dL — ABNORMAL HIGH (ref 70–99)
Glucose-Capillary: 101 mg/dL — ABNORMAL HIGH (ref 70–99)
Glucose-Capillary: 113 mg/dL — ABNORMAL HIGH (ref 70–99)

## 2020-05-18 LAB — SURGICAL PATHOLOGY

## 2020-05-18 MED ORDER — OXYCODONE HCL 5 MG PO TABS: 5.0000 mg | ORAL_TABLET | Freq: Four times a day (QID) | ORAL | 0 refills | Status: DC | PRN

## 2020-05-18 MED ORDER — AMOXICILLIN-POT CLAVULANATE 875-125 MG PO TABS: 1.0000 | ORAL_TABLET | Freq: Two times a day (BID) | ORAL | 0 refills | Status: AC

## 2020-05-18 MED ORDER — PREGABALIN 50 MG PO CAPS: 50.0000 mg | ORAL_CAPSULE | Freq: Three times a day (TID) | ORAL | 0 refills | Status: DC

## 2020-05-18 MED ORDER — IBUPROFEN 600 MG PO TABS: 600.0000 mg | ORAL_TABLET | Freq: Four times a day (QID) | ORAL | 0 refills | Status: DC | PRN

## 2020-05-18 NOTE — Discharge Summary (Addendum)
[facility] SURGICAL DISCHARGE SUMMARY  Patient ID: Jacob Coleman MRN: [id number] DOB/AGE: October 06, 1965 55 y.o.  Admit date: 05/15/2020 Discharge date: 05/18/2020  Discharge Diagnoses Patient Active Problem List   Diagnosis Date Noted   Acute calculous cholecystitis 05/15/2020    Consultants None  Procedures 05/16/2020:  Robotic assisted laparoscopic cholecystectomy  HPI: 55 y.o. male presented to [facility] ED yesterday for abdominal pain. Patient reports around a 24 hour history of RUQ abdominal pain which has been constant and progressively getting worse. He described this as a sharp and aching pain. He had a similar episode about 2 weeks ago but this spontaneously resolved. He endorsed associated nausea and decreased appetite with the pain. No fever, chills, cough, CP, SOB, emesis, or bowel changes. No previous intra-abdominal surgeries. Work up in the ED revealed a leukocytosis to 16.5K and RUQ [place] was concerning for cholecystitis.   Hospital Course: Informed consent was obtained and documented, and patient underwent uneventful robotic assisted laparoscopic cholecystectomy (Dr Celine Ahr, 05/16/2020). Post-operatively, patient had issues with incisional soreness son POD1 but this improved. Advancement of patient's diet and ambulation were well-tolerated. The remainder of patient's hospital course was essentially unremarkable, and discharge planning was initiated accordingly with patient safely able to be discharged home with appropriate discharge instructions, antibiotics (Augmentin x7 days), pain control, and outpatient follow-up after all of his questions were answered to his  expressed satisfaction.   Discharge Condition: Good    Physical Examination:  Constitutional: alert, cooperative and no distress  Respiratory: breathing non-labored at rest  Cardiovascular: regular rate and sinus rhythm  Gastrointestinal: Soft, incisional soreness markedly improved this morning,  non-distended, no rebound/guarding Integumentary: Laparoscopic incisions are CDI with dermabond, no erythema or drainage    Allergies as of 05/18/2020   No Known Allergies      Medication List     TAKE these medications    amoxicillin-clavulanate 875-125 MG tablet Commonly known as: Augmentin Take 1 tablet by mouth 2 (two) times daily for 7 days.   aspirin EC 81 MG tablet Take 1 tablet by mouth daily.   atorvastatin 10 MG tablet Commonly known as: LIPITOR Take 1 tablet (10 mg total) by mouth daily.   empagliflozin 25 MG Tabs tablet Commonly known as: JARDIANCE Take by mouth.   glimepiride 2 MG tablet Commonly known as: AMARYL Take 1 tablet (2 mg total) by mouth daily before breakfast. What changed: additional instructions   ibuprofen 600 MG tablet Commonly known as: ADVIL Take 1 tablet (600 mg total) by mouth every 6 (six) hours as needed.   lisinopril 10 MG tablet Commonly known as: ZESTRIL Take 1 tablet (10 mg total) by mouth daily.   metFORMIN 1000 MG tablet Commonly known as: GLUCOPHAGE Take 1 tablet (1,000 mg total) by mouth 2 (two) times daily. What changed: additional instructions   oxyCODONE 5 MG immediate release tablet Commonly known as: Oxy IR/ROXICODONE Take 1 tablet (5 mg total) by mouth every 6 (six) hours as needed for severe pain or breakthrough pain.   pregabalin 50 MG capsule Commonly known as: LYRICA Take 1 capsule (50 mg total) by mouth 3 (three) times daily for 14 days.          Follow-up Information     Tylene Fantasia, PA-C. Schedule an appointment as soon as possible for a visit in 2 week(s).   Specialty: Physician Assistant Why: s/p laparoscopic cholecystectomy  Contact information: [address] [place] [address] [phone]  Time spent on discharge management including discussion of hospital course, clinical condition, outpatient instructions, prescriptions, and follow up with  the patient and members of the medical team: >30 minutes  -- Edison Simon , PA-C [facility]  05/18/2020, 8:47 AM [phone] M-F: 7am - 4pm  The patient had departed the facility prior to my evaluation.  I did, however, discuss his case and care with [name] and I concur with the above documentation.

## 2020-05-18 NOTE — Discharge Instructions (Signed)
In addition to included general post-operative instructions,  Diet: Resume home diet. Recommend avoiding or limiting fatty/greasy foods over the next few days/week. If you do eat these, you may (or may not) notice diarrhea. This is expected while your body adjusts to not having a gallbladder, and it typically resolves with time.   Activity: No heavy lifting >20 pounds (children, pets, laundry, garbage) for 4 weeks, but light activity and walking are encouraged. Do not drive or drink alcohol if taking narcotic pain medications or having pain that might distract from driving.  Wound care: You may shower/get incision wet with soapy water and pat dry (do not rub incisions), but no baths or submerging incision underwater until follow-up.   Medications: Resume all home medications. For mild to moderate pain: acetaminophen (Tylenol) or ibuprofen/naproxen (if no kidney disease). Combining Tylenol with alcohol can substantially increase your risk of causing liver disease. Narcotic pain medications, if prescribed, can be used for severe pain, though may cause nausea, constipation, and drowsiness. Do not combine Tylenol and Percocet (or similar) within a 6 hour period as Percocet (and similar) contain(s) Tylenol. If you do not need the narcotic pain medication, you do not need to fill the prescription.  Call office [phone] / [phone]) at any time if any questions, worsening pain, fevers/chills, bleeding, drainage from incision site, or other concerns.

## 2020-05-18 NOTE — Plan of Care (Signed)

## 2020-06-01 ENCOUNTER — Ambulatory Visit (INDEPENDENT_AMBULATORY_CARE_PROVIDER_SITE_OTHER): Payer: BC Managed Care – PPO | Admitting: Physician Assistant

## 2020-06-01 ENCOUNTER — Other Ambulatory Visit: Payer: Self-pay

## 2020-06-01 ENCOUNTER — Encounter: Payer: Self-pay | Admitting: Physician Assistant

## 2020-06-01 VITALS — BP 112/73 | HR 86 | Temp 98.5°F | Ht 72.0 in | Wt 202.0 lb

## 2020-06-01 DIAGNOSIS — K8000 Calculus of gallbladder with acute cholecystitis without obstruction: Secondary | ICD-10-CM

## 2020-06-01 DIAGNOSIS — Z09 Encounter for follow-up examination after completed treatment for conditions other than malignant neoplasm: Secondary | ICD-10-CM

## 2020-06-01 NOTE — Progress Notes (Signed)
[name] POST-OP OFFICE VISIT  06/01/2020  HPI: Jacob Coleman is a 55 y.o. male 16 days s/p robotic assisted laparoscopic cholecystectomy for acute cholecystitis with Dr Celine Ahr  He has done well since coming home from the hospital Pain overall subsided on POD3-4 He only notices a very mild, intermittent soreness.  No fever, chills, nausea, emesis No issues with incisions Tolerating a diet; no diarrhea  Vital signs: BP 112/73   Pulse 86   Temp 98.5 F (36.9 C)   Ht 6' (1.829 m)   Wt 202 lb (91.6 kg)   SpO2 97%   BMI 27.40 kg/m    Physical Exam: Constitutional: Well appearing male, NAD Abdomen: Soft, non-tender, non-distended, no rebound/guarding Skin: Laparoscopic incisions are well healed, some expectant induration, no erythema ot drinage  Assessment/Plan: This is a 55 y.o. male 16 days s/p robotic assisted laparoscopic cholecystectomy for acute cholecystitis   - Pain control prn with OTC medications  - Reviewed wound care  - Reviewed lifting restrictions; 4 weeks total  - Reviewed surgical pathology: Acute necrotizing cholecystitis, negative for malignancy  - He will rtc on an as needed basis   -- Edison Simon, PA-C [facility] 06/01/2020, 1:37 PM [phone] M-F: 7am - 4pm

## 2020-06-01 NOTE — Patient Instructions (Addendum)
Follow-up with our office as needed.  Please call and ask to speak with a nurse if you develop questions or concerns.   GENERAL POST-OPERATIVE PATIENT INSTRUCTIONS   WOUND CARE INSTRUCTIONS: Try to keep the wound dry and avoid ointments on the wound unless directed to do so.  If the wound becomes bright red and painful or starts to drain infected material that is not clear, please contact your physician immediately.  If the wound is mildly pink and has a thick firm ridge underneath it, this is normal, and is referred to as a healing ridge.  This will resolve over the next 4-6 weeks.  BATHING: You may shower if you have been informed of this by your surgeon. However, Please do not submerge in a tub, hot tub, or pool until incisions are completely sealed.  DIET:  You may eat any foods that you can tolerate.  It is a good idea to eat a high fiber diet and take in plenty of fluids to prevent constipation.  If you do become constipated you may want to take a mild laxative or take ducolax tablets on a daily basis until your bowel habits are regular.  Constipation can be very uncomfortable, along with straining, after recent surgery.  ACTIVITY: You are encouraged to walk and engage in light activity for the next two weeks.  You should not lift more than 20 pounds for 4 weeks after surgery as it could put you at increased risk for complications.  Twenty pounds is roughly equivalent to a plastic bag of groceries. At that time- Listen to your body when lifting, if you have pain when lifting, stop and then try again in a few days. Soreness after doing exercises or activities of daily living is normal as you get back in to your normal routine.  MEDICATIONS: Should you develop nausea and vomiting from the pain medication, or develop a rash, please discontinue the medication and contact your physician.  You should not drive, make important decisions, or operate machinery when taking narcotic pain  medication.  SUNBLOCK Use sun block to incision area over the next year if this area will be exposed to sun. This helps decrease scarring and will allow you avoid a permanent darkened area over your incision.  QUESTIONS:  Please feel free to call our office if you have any questions, and we will be glad to assist you.

## 2020-06-30 DIAGNOSIS — E785 Hyperlipidemia, unspecified: Secondary | ICD-10-CM | POA: Diagnosis not present

## 2020-06-30 DIAGNOSIS — E1169 Type 2 diabetes mellitus with other specified complication: Secondary | ICD-10-CM | POA: Diagnosis not present

## 2020-06-30 DIAGNOSIS — E1165 Type 2 diabetes mellitus with hyperglycemia: Secondary | ICD-10-CM | POA: Diagnosis not present

## 2020-06-30 LAB — HEPATIC FUNCTION PANEL
ALT: 21 (ref 10–40)
AST: 6 — AB (ref 14–40)
AST: 6 — AB (ref 14–40)
AST: 60 — AB (ref 14–40)

## 2020-06-30 LAB — LIPID PANEL
Cholesterol: 127 (ref 0–200)
HDL: 38 (ref 35–70)
LDL Cholesterol: 61
Triglycerides: 141 (ref 40–160)

## 2020-06-30 LAB — HEMOGLOBIN A1C: Hemoglobin A1C: 7

## 2020-06-30 LAB — BASIC METABOLIC PANEL
BUN: 15 (ref 4–21)
Creatinine: 0.9 (ref 0.6–1.3)

## 2020-06-30 LAB — MICROALBUMIN, URINE: Microalb, Ur: 43.7

## 2020-07-07 DIAGNOSIS — E1169 Type 2 diabetes mellitus with other specified complication: Secondary | ICD-10-CM | POA: Diagnosis not present

## 2020-07-07 DIAGNOSIS — E1165 Type 2 diabetes mellitus with hyperglycemia: Secondary | ICD-10-CM | POA: Diagnosis not present

## 2020-07-07 DIAGNOSIS — E1159 Type 2 diabetes mellitus with other circulatory complications: Secondary | ICD-10-CM | POA: Diagnosis not present

## 2020-07-07 DIAGNOSIS — F17200 Nicotine dependence, unspecified, uncomplicated: Secondary | ICD-10-CM | POA: Diagnosis not present

## 2020-07-29 ENCOUNTER — Other Ambulatory Visit: Payer: Self-pay | Admitting: Family Medicine

## 2020-07-29 DIAGNOSIS — I10 Essential (primary) hypertension: Secondary | ICD-10-CM

## 2020-07-31 ENCOUNTER — Ambulatory Visit: Payer: BC Managed Care – PPO | Admitting: Family Medicine

## 2020-08-03 ENCOUNTER — Other Ambulatory Visit: Payer: Self-pay

## 2020-08-03 ENCOUNTER — Encounter: Payer: Self-pay | Admitting: Family Medicine

## 2020-08-03 ENCOUNTER — Ambulatory Visit (INDEPENDENT_AMBULATORY_CARE_PROVIDER_SITE_OTHER): Payer: BC Managed Care – PPO | Admitting: Family Medicine

## 2020-08-03 VITALS — BP 120/62 | HR 88 | Ht 72.0 in | Wt 215.0 lb

## 2020-08-03 DIAGNOSIS — E663 Overweight: Secondary | ICD-10-CM

## 2020-08-03 DIAGNOSIS — Z23 Encounter for immunization: Secondary | ICD-10-CM

## 2020-08-03 DIAGNOSIS — I10 Essential (primary) hypertension: Secondary | ICD-10-CM

## 2020-08-03 DIAGNOSIS — E785 Hyperlipidemia, unspecified: Secondary | ICD-10-CM | POA: Diagnosis not present

## 2020-08-03 MED ORDER — LISINOPRIL 10 MG PO TABS: 10.0000 mg | ORAL_TABLET | Freq: Every day | ORAL | 1 refills | Status: DC

## 2020-08-03 MED ORDER — ATORVASTATIN CALCIUM 10 MG PO TABS: 10.0000 mg | ORAL_TABLET | Freq: Every day | ORAL | 1 refills | Status: DC

## 2020-08-03 NOTE — Patient Instructions (Signed)

## 2020-08-03 NOTE — Progress Notes (Signed)
Date:  08/03/2020   Name:  Jacob Coleman   DOB:  Sep 18, 1965   MRN:  [id number]   Chief Complaint: Hypertension and Hyperlipidemia  Hypertension This is a chronic problem. The current episode started more than 1 year ago. The problem has been gradually improving since onset. The problem is controlled. Pertinent negatives include no anxiety, blurred vision, chest pain, headaches, malaise/fatigue, neck pain, orthopnea, palpitations, peripheral edema, PND, shortness of breath or sweats. Risk factors for coronary artery disease include dyslipidemia and diabetes mellitus. Past treatments include ACE inhibitors. The current treatment provides moderate improvement. There are no compliance problems.  There is no history of angina, kidney disease, CAD/MI, CVA, heart failure, left ventricular hypertrophy, PVD or retinopathy. There is no history of chronic renal disease, a hypertension causing med or renovascular disease.  Hyperlipidemia This is a chronic problem. The problem is controlled. Recent lipid tests were reviewed and are normal. Exacerbating diseases include diabetes. He has no history of chronic renal disease or hypothyroidism. Pertinent negatives include no chest pain, focal sensory loss, focal weakness, leg pain, myalgias or shortness of breath. Current antihyperlipidemic treatment includes statins. The current treatment provides moderate improvement of lipids. There are no compliance problems.  Risk factors for coronary artery disease include dyslipidemia, hypertension and male sex.   Lab Results  Component Value Date   CREATININE 0.9 06/30/2020   BUN 15 06/30/2020   NA 133 (L) 05/17/2020   K 4.4 05/17/2020   CL 107 05/17/2020   CO2 14 (L) 05/17/2020   Lab Results  Component Value Date   CHOL 127 06/30/2020   HDL 38 06/30/2020   LDLCALC 61 06/30/2020   LDLDIRECT 102 (H) 06/29/2018   TRIG 141 06/30/2020   CHOLHDL 4.0 08/19/2018   No results found for: TSH Lab Results   Component Value Date   HGBA1C 7.0 06/30/2020   Lab Results  Component Value Date   WBC 16.2 (H) 05/17/2020   HGB 16.3 05/17/2020   HCT 46.0 05/17/2020   MCV 93.5 05/17/2020   PLT 228 05/17/2020   Lab Results  Component Value Date   ALT 21 06/30/2020   AST 6 (A) 06/30/2020   AST 60 (A) 06/30/2020   AST 6 (A) 06/30/2020   ALKPHOS 67 05/17/2020   BILITOT 1.5 (H) 05/17/2020     Review of Systems  Constitutional:  Negative for chills, fever and malaise/fatigue.  HENT:  Negative for drooling, ear discharge, ear pain and sore throat.   Eyes:  Negative for blurred vision.  Respiratory:  Negative for cough, shortness of breath and wheezing.   Cardiovascular:  Negative for chest pain, palpitations, orthopnea, leg swelling and PND.  Gastrointestinal:  Negative for abdominal pain, blood in stool, constipation, diarrhea and nausea.  Endocrine: Negative for polydipsia.  Genitourinary:  Negative for dysuria, frequency, hematuria and urgency.  Musculoskeletal:  Negative for back pain, myalgias and neck pain.  Skin:  Negative for rash.  Allergic/Immunologic: Negative for environmental allergies.  Neurological:  Negative for dizziness, focal weakness and headaches.  Hematological:  Does not bruise/bleed easily.  Psychiatric/Behavioral:  Negative for suicidal ideas. The patient is not nervous/anxious.    Patient Active Problem List   Diagnosis Date Noted   Acute calculous cholecystitis 05/15/2020   Essential hypertension 06/29/2018   Adhesive capsulitis of left shoulder 06/26/2017   DDD (degenerative disc disease), lumbar 03/16/2014   Low back pain 03/16/2014   Lumbar radicular pain 03/16/2014   Superior glenoid labrum lesion 02/10/2014  Aftercare following surgery for neoplasm 01/20/2012   Basal cell carcinoma 12/09/2011   ED (erectile dysfunction) 10/08/2011   Dyslipidemia 08/06/2011   Diabetes mellitus without complication ([place]) 56/38/7564   Insomnia 05/07/2011    No Known  Allergies  Past Surgical History:  Procedure Laterality Date   CHOLECYSTECTOMY  05/16/2020    Social History   Tobacco Use   Smoking status: Every Day    Pack years: 0.00    Types: E-cigarettes   Smokeless tobacco: Never   Tobacco comments:    wants info on quiting  Substance Use Topics   Alcohol use: Yes   Drug use: Never     Medication list has been reviewed and updated.  Current Meds  Medication Sig   aspirin EC 81 MG tablet Take 1 tablet by mouth daily.   atorvastatin (LIPITOR) 10 MG tablet Take 1 tablet (10 mg total) by mouth daily.   empagliflozin (JARDIANCE) 25 MG TABS tablet Take by mouth.   glimepiride (AMARYL) 2 MG tablet Take 1 tablet (2 mg total) by mouth daily before breakfast. (Patient taking differently: Take 2 mg by mouth daily before breakfast. solum)   ibuprofen (ADVIL) 600 MG tablet Take 1 tablet (600 mg total) by mouth every 6 (six) hours as needed.   lisinopril (ZESTRIL) 10 MG tablet Take 1 tablet (10 mg total) by mouth daily.   metFORMIN (GLUCOPHAGE) 1000 MG tablet Take 1 tablet (1,000 mg total) by mouth 2 (two) times daily. (Patient taking differently: Take 1,000 mg by mouth 2 (two) times daily. solum)    PHQ 2/9 Scores 08/03/2020 01/28/2020 01/26/2019 08/19/2018  PHQ - 2 Score 0 0 0 0  PHQ- 9 Score 0 0 0 0    GAD 7 : Generalized Anxiety Score 08/03/2020  Nervous, Anxious, on Edge 0  Control/stop worrying 0  Worry too much - different things 0  Trouble relaxing 0  Restless 0  Easily annoyed or irritable 0  Afraid - awful might happen 0  Total GAD 7 Score 0    BP Readings from Last 3 Encounters:  08/03/20 120/62  06/01/20 112/73  05/18/20 119/72    Physical Exam Vitals and nursing note reviewed.  HENT:     Head: Normocephalic.     Right Ear: Tympanic membrane, ear canal and external ear normal. There is no impacted cerumen.     Left Ear: Tympanic membrane, ear canal and external ear normal. There is no impacted cerumen.     Nose: Nose  normal. No congestion or rhinorrhea.     Mouth/Throat:     Mouth: Mucous membranes are moist.  Eyes:     General: No scleral icterus.       Right eye: No discharge.        Left eye: No discharge.     Conjunctiva/sclera: Conjunctivae normal.     Pupils: Pupils are equal, round, and reactive to light.  Neck:     Thyroid: No thyromegaly.     Vascular: No JVD.     Trachea: No tracheal deviation.  Cardiovascular:     Rate and Rhythm: Normal rate and regular rhythm.     Heart sounds: Normal heart sounds. No murmur heard.   No friction rub. No gallop.  Pulmonary:     Effort: No respiratory distress.     Breath sounds: Normal breath sounds. No wheezing, rhonchi or rales.  Abdominal:     General: Bowel sounds are normal.     Palpations: Abdomen is soft. There is  no mass.     Tenderness: There is no abdominal tenderness. There is no guarding or rebound.  Musculoskeletal:        General: No tenderness. Normal range of motion.     Cervical back: Normal range of motion and neck supple.  Lymphadenopathy:     Cervical: No cervical adenopathy.  Skin:    General: Skin is warm.     Findings: No rash.  Neurological:     Mental Status: He is alert and oriented to person, place, and time.     Cranial Nerves: No cranial nerve deficit.     Deep Tendon Reflexes: Reflexes are normal and symmetric.    Wt Readings from Last 3 Encounters:  08/03/20 215 lb (97.5 kg)  06/01/20 202 lb (91.6 kg)  05/15/20 209 lb (94.8 kg)    BP 120/62   Pulse 88   Ht 6' (1.829 m)   Wt 215 lb (97.5 kg)   BMI 29.16 kg/m   Assessment and Plan:  1. Essential hypertension Chronic.  Controlled.  Stable.  Blood pressure today is 120/62.  Patient is tolerating medication well and will continue on lisinopril 10 mg once a day.  Renal panel was reviewed per diabetes management and is acceptable for current electrolytes and GFR.  We will recheck patient in 6 months. - lisinopril (ZESTRIL) 10 MG tablet; Take 1 tablet (10  mg total) by mouth daily.  Dispense: 90 tablet; Refill: 1  2. Dyslipidemia Chronic.  Controlled.  Stable.  Lipid is in reasonable range and is currently controlled on atorvastatin 10 mg once a day and this will be continued. - atorvastatin (LIPITOR) 10 MG tablet; Take 1 tablet (10 mg total) by mouth daily.  Dispense: 90 tablet; Refill: 1  3. Overweight (BMI 25.0-29.9) Patient is in the 29 BMI range this is status post gallbladder surgery.  Have cautioned patient on weight gain and that this crosses over a milestone.  In the meantime I have encouraged guidelines of a Mediterranean diet for preventative reasons.  We will recheck patient in 6 months.

## 2020-08-24 LAB — HM DIABETES EYE EXAM

## 2020-10-10 DIAGNOSIS — L578 Other skin changes due to chronic exposure to nonionizing radiation: Secondary | ICD-10-CM | POA: Diagnosis not present

## 2020-10-10 DIAGNOSIS — Z872 Personal history of diseases of the skin and subcutaneous tissue: Secondary | ICD-10-CM | POA: Diagnosis not present

## 2020-10-10 DIAGNOSIS — Z86018 Personal history of other benign neoplasm: Secondary | ICD-10-CM | POA: Diagnosis not present

## 2020-10-10 DIAGNOSIS — L570 Actinic keratosis: Secondary | ICD-10-CM | POA: Diagnosis not present

## 2020-10-10 DIAGNOSIS — Z85828 Personal history of other malignant neoplasm of skin: Secondary | ICD-10-CM | POA: Diagnosis not present

## 2020-11-27 ENCOUNTER — Encounter: Payer: Self-pay | Admitting: General Surgery

## 2021-01-27 ENCOUNTER — Other Ambulatory Visit: Payer: Self-pay | Admitting: Family Medicine

## 2021-01-27 DIAGNOSIS — E785 Hyperlipidemia, unspecified: Secondary | ICD-10-CM

## 2021-01-27 DIAGNOSIS — I10 Essential (primary) hypertension: Secondary | ICD-10-CM

## 2021-01-27 NOTE — Telephone Encounter (Signed)
Requested medication (s) are due for refill today: yes  Requested medication (s) are on the active medication list: yes  Last refill:  08/03/20  Future visit scheduled: yes  Notes to clinic:  overdue Creatinine and potassium levels   Requested Prescriptions  Pending Prescriptions Disp Refills   lisinopril (ZESTRIL) 10 MG tablet [Pharmacy Med Name: LISINOPRIL 10 MG TABLET] 90 tablet 0    Sig: TAKE 1 TABLET BY MOUTH DAILY.     Cardiovascular:  ACE Inhibitors Failed - 01/27/2021  6:51 AM      Failed - Cr in normal range and within 180 days    Creatinine  Date Value Ref Range Status  06/30/2020 0.9 0.6 - 1.3 Final   Creatinine, Ser  Date Value Ref Range Status  05/17/2020 0.97 0.61 - 1.24 mg/dL Final          Failed - K in normal range and within 180 days    Potassium  Date Value Ref Range Status  05/17/2020 4.4 3.5 - 5.1 mmol/L Final          Passed - Patient is not pregnant      Passed - Last BP in normal range    BP Readings from Last 1 Encounters:  08/03/20 120/62          Passed - Valid encounter within last 6 months    Recent Outpatient Visits           5 months ago Essential hypertension   [facility] Juline Patch, [facility]   1 year ago Annual physical exam   [facility], [facility]   2 years ago Annual physical exam   [facility] Juline Patch, [facility]   2 years ago Essential hypertension   [place], Deanna C, MD   2 years ago Diabetes mellitus without complication [facility])   [facility] Juline Patch, MD       Future Appointments             In 1 week Juline Patch, MD [facility], [facility]            Signed Prescriptions Disp Refills   atorvastatin (LIPITOR) 10 MG tablet 90 tablet 0    Sig: TAKE 1 TABLET BY MOUTH DAILY.     Cardiovascular:  Antilipid - Statins Passed - 01/27/2021  6:51 AM      Passed - Total Cholesterol in normal range and within 360 days     Cholesterol, Total  Date Value Ref Range Status  01/28/2020 141 100 - 199 mg/dL Final   Cholesterol  Date Value Ref Range Status  06/30/2020 127 0 - 200 Final          Passed - LDL in normal range and within 360 days    LDL Chol Calc (NIH)  Date Value Ref Range Status  01/28/2020 78 0 - 99 mg/dL Final   LDL Cholesterol  Date Value Ref Range Status  06/30/2020 61  Final   LDL Direct  Date Value Ref Range Status  06/29/2018 102 (H) 0 - 99 mg/dL Final          Passed - HDL in normal range and within 360 days    HDL  Date Value Ref Range Status  06/30/2020 38 35 - 70 Final  01/28/2020 35 (L) >39 mg/dL Final          Passed - Triglycerides in normal range and within 360  days    Triglycerides  Date Value Ref Range Status  06/30/2020 141 40 - 160 Final          Passed - Patient is not pregnant      Passed - Valid encounter within last 12 months    Recent Outpatient Visits           5 months ago Essential hypertension   [place], Deanna C, MD   1 year ago Annual physical exam   [facility] Juline Patch, MD   2 years ago Annual physical exam   [facility] Juline Patch, MD   2 years ago Essential hypertension   [place], Deanna C, MD   2 years ago Diabetes mellitus without complication [facility])   [place], Deanna C, MD       Future Appointments             In 1 week Juline Patch, MD [facility], [facility]

## 2021-01-27 NOTE — Telephone Encounter (Signed)
Requested Prescriptions  Pending Prescriptions Disp Refills  . atorvastatin (LIPITOR) 10 MG tablet [Pharmacy Med Name: ATORVASTATIN 10 MG TABLET] 90 tablet 0    Sig: TAKE 1 TABLET BY MOUTH DAILY.     Cardiovascular:  Antilipid - Statins Passed - 01/27/2021  6:51 AM      Passed - Total Cholesterol in normal range and within 360 days    Cholesterol, Total  Date Value Ref Range Status  01/28/2020 141 100 - 199 mg/dL Final   Cholesterol  Date Value Ref Range Status  06/30/2020 127 0 - 200 Final         Passed - LDL in normal range and within 360 days    LDL Chol Calc (NIH)  Date Value Ref Range Status  01/28/2020 78 0 - 99 mg/dL Final   LDL Cholesterol  Date Value Ref Range Status  06/30/2020 61  Final   LDL Direct  Date Value Ref Range Status  06/29/2018 102 (H) 0 - 99 mg/dL Final         Passed - HDL in normal range and within 360 days    HDL  Date Value Ref Range Status  06/30/2020 38 35 - 70 Final  01/28/2020 35 (L) >39 mg/dL Final         Passed - Triglycerides in normal range and within 360 days    Triglycerides  Date Value Ref Range Status  06/30/2020 141 40 - 160 Final         Passed - Patient is not pregnant      Passed - Valid encounter within last 12 months    Recent Outpatient Visits          5 months ago Essential hypertension   [facility], [facility]   1 year ago Annual physical exam   [facility] Jul[facility] Patch, MD   2 years ago Annual physical exam   [facility] Juline Patch, MD   2 years ago Essential hypertension   [place], Deanna C, MD   2 years ago Diabetes mellitus without complication [facility])   [facility] Juline Patch, MD      Future Appointments            In 1 week Juline Patch, MD [facility], [place]           . lisinopril (ZESTRIL) 10 MG tablet [Pharmacy Med Name: LISINOPRIL 10 MG TABLET] 30 tablet     Sig: TAKE 1 TABLET BY MOUTH DAILY.      Cardiovascular:  ACE Inhibitors Failed - 01/27/2021  6:51 AM      Failed - Cr in normal range and within 180 days    Creatinine  Date Value Ref Range Status  06/30/2020 0.9 0.6 - 1.3 Final   Creatinine, Ser  Date Value Ref Range Status  05/17/2020 0.97 0.61 - 1.24 mg/dL Final         Failed - K in normal range and within 180 days    Potassium  Date Value Ref Range Status  05/17/2020 4.4 3.5 - 5.1 mmol/L Final         Passed - Patient is not pregnant      Passed - Last BP in normal range    BP Readings from Last 1 Encounters:  08/03/20 120/62         Passed - Valid encounter within last 6 months    Recent Outpatient  Visits          5 months ago Essential hypertension   [facility], MD   1 year ago Annual physical exam   [facility] Juline Patch, MD   2 years ago Annual physical exam   [facility] Juline Patch, MD   2 years ago Essential hypertension   [facility] Juline Patch, MD   2 years ago Diabetes mellitus without complication [facility])   [facility] Juline Patch, MD      Future Appointments            In 1 week Juline Patch, MD [facility], [facility]

## 2021-01-27 NOTE — Telephone Encounter (Signed)
Requested Prescriptions  Pending Prescriptions Disp Refills  . atorvastatin (LIPITOR) 10 MG tablet [Pharmacy Med Name: ATORVASTATIN 10 MG TABLET] 90 tablet 1    Sig: TAKE 1 TABLET BY MOUTH DAILY.     Cardiovascular:  Antilipid - Statins Passed - 01/27/2021  6:51 AM      Passed - Total Cholesterol in normal range and within 360 days    Cholesterol, Total  Date Value Ref Range Status  01/28/2020 141 100 - 199 mg/dL Final   Cholesterol  Date Value Ref Range Status  06/30/2020 127 0 - 200 Final         Passed - LDL in normal range and within 360 days    LDL Chol Calc (NIH)  Date Value Ref Range Status  01/28/2020 78 0 - 99 mg/dL Final   LDL Cholesterol  Date Value Ref Range Status  06/30/2020 61  Final   LDL Direct  Date Value Ref Range Status  06/29/2018 102 (H) 0 - 99 mg/dL Final         Passed - HDL in normal range and within 360 days    HDL  Date Value Ref Range Status  06/30/2020 38 35 - 70 Final  01/28/2020 35 (L) >39 mg/dL Final         Passed - Triglycerides in normal range and within 360 days    Triglycerides  Date Value Ref Range Status  06/30/2020 141 40 - 160 Final         Passed - Patient is not pregnant      Passed - Valid encounter within last 12 months    Recent Outpatient Visits          5 months ago Essential hypertension   [facility], [facility]   1 year ago Annual physical exam   [facility] Juline Patch, MD   2 years ago Annual physical exam   [facility] Juline Patch, MD   2 years ago Essential hypertension   [place], Deanna C, MD   2 years ago Diabetes mellitus without complication [facility])   [facility] Juline Patch, MD      Future Appointments            In 1 week Juline Patch, MD [facility], [place]           . lisinopril (ZESTRIL) 10 MG tablet [Pharmacy Med Name: LISINOPRIL 10 MG TABLET] 30 tablet     Sig: TAKE 1 TABLET BY MOUTH DAILY.      Cardiovascular:  ACE Inhibitors Failed - 01/27/2021  6:51 AM      Failed - Cr in normal range and within 180 days    Creatinine  Date Value Ref Range Status  06/30/2020 0.9 0.6 - 1.3 Final   Creatinine, Ser  Date Value Ref Range Status  05/17/2020 0.97 0.61 - 1.24 mg/dL Final         Failed - K in normal range and within 180 days    Potassium  Date Value Ref Range Status  05/17/2020 4.4 3.5 - 5.1 mmol/L Final         Passed - Patient is not pregnant      Passed - Last BP in normal range    BP Readings from Last 1 Encounters:  08/03/20 120/62         Passed - Valid encounter within last 6 months    Recent Outpatient  Visits          5 months ago Essential hypertension   [facility] Jul[facility], MD   1 year ago Annual physical exam   [facility] Juline Patch, MD   2 years ago Annual physical exam   [facility] Juline Patch, MD   2 years ago Essential hypertension   [facility] Juline Patch, MD   2 years ago Diabetes mellitus without complication [facility])   [facility] Juline Patch, MD      Future Appointments            In 1 week Juline Patch, MD [facility], [facility]

## 2021-02-06 ENCOUNTER — Ambulatory Visit: Payer: BC Managed Care – PPO | Admitting: Family Medicine

## 2021-06-14 ENCOUNTER — Ambulatory Visit: Payer: Self-pay | Admitting: Family Medicine

## 2021-06-27 ENCOUNTER — Encounter: Payer: Self-pay | Admitting: Family Medicine

## 2021-06-27 ENCOUNTER — Ambulatory Visit (INDEPENDENT_AMBULATORY_CARE_PROVIDER_SITE_OTHER): Payer: BC Managed Care – PPO | Admitting: Family Medicine

## 2021-06-27 VITALS — BP 120/80 | HR 70 | Ht 72.0 in | Wt 212.0 lb

## 2021-06-27 DIAGNOSIS — E785 Hyperlipidemia, unspecified: Secondary | ICD-10-CM | POA: Diagnosis not present

## 2021-06-27 DIAGNOSIS — I10 Essential (primary) hypertension: Secondary | ICD-10-CM | POA: Diagnosis not present

## 2021-06-27 MED ORDER — ATORVASTATIN CALCIUM 10 MG PO TABS: 10.0000 mg | ORAL_TABLET | Freq: Every day | ORAL | 1 refills | Status: DC

## 2021-06-27 MED ORDER — LISINOPRIL 10 MG PO TABS: 10.0000 mg | ORAL_TABLET | Freq: Every day | ORAL | 1 refills | Status: DC

## 2021-06-27 NOTE — Progress Notes (Signed)
? ? ?Date:  06/27/2021  ? ?Name:  Jacob Coleman   DOB:  Mar 10, 1965   MRN:  [id number] ? ? ?Chief Complaint: Hypertension and Hyperlipidemia ? ?Hypertension ?This is a chronic problem. The current episode started more than 1 year ago. The problem has been gradually improving since onset. The problem is controlled. Pertinent negatives include no anxiety, blurred vision, chest pain, headaches, malaise/fatigue, neck pain, orthopnea, palpitations, peripheral edema, PND, shortness of breath or sweats. There are no known risk factors for coronary artery disease. Past treatments include ACE inhibitors. The current treatment provides moderate improvement. There are no compliance problems.  There is no history of angina, kidney disease, CAD/MI, CVA, heart failure, left ventricular hypertrophy, PVD or retinopathy. There is no history of chronic renal disease, a hypertension causing med or renovascular disease.  ?Hyperlipidemia ?This is a chronic problem. The current episode started more than 1 year ago. The problem is controlled. Recent lipid tests were reviewed and are normal. He has no history of chronic renal disease, hypothyroidism or obesity. Pertinent negatives include no chest pain, focal sensory loss, focal weakness, leg pain, myalgias or shortness of breath. Current antihyperlipidemic treatment includes statins. The current treatment provides moderate improvement of lipids.  ? ?Lab Results  ?Component Value Date  ? NA 133 (L) 05/17/2020  ? K 4.4 05/17/2020  ? CO2 14 (L) 05/17/2020  ? GLUCOSE 118 (H) 05/17/2020  ? BUN 15 06/30/2020  ? CREATININE 0.9 06/30/2020  ? CALCIUM 8.7 (L) 05/17/2020  ? GFRNONAA >60 05/17/2020  ? ?Lab Results  ?Component Value Date  ? CHOL 127 06/30/2020  ? HDL 38 06/30/2020  ? [place] 61 06/30/2020  ? LDLDIRECT 102 (H) 06/29/2018  ? TRIG 141 06/30/2020  ? CHOLHDL 4.0 08/19/2018  ? ?No results found for: TSH ?Lab Results  ?Component Value Date  ? HGBA1C 7.0 06/30/2020  ? ?Lab Results   ?Component Value Date  ? WBC 16.2 (H) 05/17/2020  ? HGB 16.3 05/17/2020  ? HCT 46.0 05/17/2020  ? MCV 93.5 05/17/2020  ? PLT 228 05/17/2020  ? ?Lab Results  ?Component Value Date  ? ALT 21 06/30/2020  ? AST 6 (A) 06/30/2020  ? AST 60 (A) 06/30/2020  ? AST 6 (A) 06/30/2020  ? ALKPHOS 67 05/17/2020  ? BILITOT 1.5 (H) 05/17/2020  ? ?No results found for: 25OHVITD2, [place], VD25OH  ? ?Review of Systems  ?Constitutional:  Negative for chills, fever and malaise/fatigue.  ?HENT:  Negative for drooling, ear discharge, ear pain and sore throat.   ?Eyes:  Negative for blurred vision.  ?Respiratory:  Negative for cough, shortness of breath and wheezing.   ?Cardiovascular:  Negative for chest pain, palpitations, orthopnea, leg swelling and PND.  ?Gastrointestinal:  Negative for abdominal pain, blood in stool, constipation, diarrhea and nausea.  ?Endocrine: Negative for polydipsia.  ?Genitourinary:  Negative for dysuria, frequency, hematuria and urgency.  ?Musculoskeletal:  Negative for back pain, myalgias and neck pain.  ?Skin:  Negative for rash.  ?Allergic/Immunologic: Negative for environmental allergies.  ?Neurological:  Negative for dizziness, focal weakness and headaches.  ?Hematological:  Does not bruise/bleed easily.  ?Psychiatric/Behavioral:  Negative for suicidal ideas. The patient is not nervous/anxious.   ? ?Patient Active Problem List  ? Diagnosis Date Noted  ? Acute calculous cholecystitis 05/15/2020  ? Essential hypertension 06/29/2018  ? Adhesive capsulitis of left shoulder 06/26/2017  ? DDD (degenerative disc disease), lumbar 03/16/2014  ? Low back pain 03/16/2014  ? Lumbar radicular pain 03/16/2014  ? Superior  glenoid labrum lesion 02/10/2014  ? Aftercare following surgery for neoplasm 01/20/2012  ? Basal cell carcinoma 12/09/2011  ? ED (erectile dysfunction) 10/08/2011  ? Dyslipidemia 08/06/2011  ? Diabetes mellitus without complication ([place]) 14/48/1856  ? Insomnia 05/07/2011  ? ? ?No Known  Allergies ? ?Past Surgical History:  ?Procedure Laterality Date  ? CHOLECYSTECTOMY  05/16/2020  ? ? ?Social History  ? ?Tobacco Use  ? Smoking status: Every Day  ?  Types: E-cigarettes  ? Smokeless tobacco: Never  ? Tobacco comments:  ?  wants info on quiting  ?Substance Use Topics  ? Alcohol use: Yes  ? Drug use: Never  ? ? ? ?Medication list has been reviewed and updated. ? ?Current Meds  ?Medication Sig  ? atorvastatin (LIPITOR) 10 MG tablet TAKE 1 TABLET BY MOUTH DAILY.  ? glimepiride (AMARYL) 2 MG tablet Take 1 tablet (2 mg total) by mouth daily before breakfast. (Patient taking differently: Take 2 mg by mouth daily before breakfast. solum)  ? ibuprofen (ADVIL) 600 MG tablet Take 1 tablet (600 mg total) by mouth every 6 (six) hours as needed.  ? lisinopril (ZESTRIL) 10 MG tablet TAKE 1 TABLET BY MOUTH DAILY.  ? metFORMIN (GLUCOPHAGE) 1000 MG tablet Take 1 tablet (1,000 mg total) by mouth 2 (two) times daily. (Patient taking differently: Take 1,000 mg by mouth 2 (two) times daily. solum)  ? [DISCONTINUED] aspirin EC 81 MG tablet Take 1 tablet by mouth daily.  ? ? ? ?  06/27/2021  ? 11:03 AM 08/03/2020  ?  9:16 AM  ?GAD 7 : Generalized Anxiety Score  ?Nervous, Anxious, on Edge 0 0  ?Control/stop worrying 0 0  ?Worry too much - different things 0 0  ?Trouble relaxing 0 0  ?Restless 0 0  ?Easily annoyed or irritable 0 0  ?Afraid - awful might happen 0 0  ?Total GAD 7 Score 0 0  ?Anxiety Difficulty Not difficult at all   ? ? ? ?  06/27/2021  ? 11:00 AM  ?Depression screen PHQ 2/9  ?Decreased Interest 0  ?Down, Depressed, Hopeless 0  ?PHQ - 2 Score 0  ?Altered sleeping 0  ?Tired, decreased energy 1  ?Change in appetite 1  ?Feeling bad or failure about yourself  0  ?Trouble concentrating 0  ?Moving slowly or fidgety/restless 0  ?Suicidal thoughts 0  ?PHQ-9 Score 2  ?Difficult doing work/chores Not difficult at all  ? ? ?BP Readings from Last 3 Encounters:  ?06/27/21 120/80  ?08/03/20 120/62  ?06/01/20 112/73   ? ? ?Physical Exam ?Vitals and nursing note reviewed.  ?HENT:  ?   Head: Normocephalic.  ?   Right Ear: External ear normal.  ?   Left Ear: External ear normal.  ?   Nose: Nose normal.  ?Eyes:  ?   General: No scleral icterus.    ?   Right eye: No discharge.     ?   Left eye: No discharge.  ?   Conjunctiva/sclera: Conjunctivae normal.  ?   Pupils: Pupils are equal, round, and reactive to light.  ?Neck:  ?   Thyroid: No thyromegaly.  ?   Vascular: No JVD.  ?   Trachea: No tracheal deviation.  ?Cardiovascular:  ?   Rate and Rhythm: Normal rate and regular rhythm.  ?   Heart sounds: Normal heart sounds. No murmur heard. ?  No friction rub. No gallop.  ?Pulmonary:  ?   Effort: No respiratory distress.  ?   Breath sounds:  Normal breath sounds. No wheezing or rales.  ?Abdominal:  ?   General: Bowel sounds are normal.  ?   Palpations: Abdomen is soft. There is no mass.  ?   Tenderness: There is no abdominal tenderness. There is no guarding or rebound.  ?Musculoskeletal:     ?   General: No tenderness. Normal range of motion.  ?   Cervical back: Normal range of motion and neck supple.  ?Lymphadenopathy:  ?   Cervical: No cervical adenopathy.  ?Neurological:  ?   Mental Status: He is alert.  ?   Deep Tendon Reflexes: Reflexes are normal and symmetric.  ? ? ?Wt Readings from Last 3 Encounters:  ?06/27/21 212 lb (96.2 kg)  ?08/03/20 215 lb (97.5 kg)  ?06/01/20 202 lb (91.6 kg)  ? ? ?BP 120/80   Pulse 70   Ht 6' (1.829 m)   Wt 212 lb (96.2 kg)   BMI 28.75 kg/m?  ? ?Assessment and Plan: ? ?1. Dyslipidemia ?Chronic.  Controlled.  Stable.  Continue atorvastatin 10 mg once a day.  Patient is obtaining labs with Dr. Honor Junes and we will wait to see the results of this before obtaining a prior labs.  At this time we will recheck patient in 59-month?- atorvastatin (LIPITOR) 10 MG tablet; Take 1 tablet (10 mg total) by mouth daily.  Dispense: 90 tablet; Refill: 1 ? ?2. Essential hypertension ?Chronic.  Controlled.  Stable.  Blood  pressure 120/80.  Continue lisinopril 10 mg once a day.  As noted above patient is having labs done at endocrinology which will probably include GFR and electrolytes.  We will recheck patient in 6 months ?- lisinopril (ZESTRIL) 10 MG table

## 2021-06-29 DIAGNOSIS — I152 Hypertension secondary to endocrine disorders: Secondary | ICD-10-CM | POA: Diagnosis not present

## 2021-06-29 DIAGNOSIS — E1169 Type 2 diabetes mellitus with other specified complication: Secondary | ICD-10-CM | POA: Diagnosis not present

## 2021-06-29 DIAGNOSIS — E1159 Type 2 diabetes mellitus with other circulatory complications: Secondary | ICD-10-CM | POA: Diagnosis not present

## 2021-06-29 DIAGNOSIS — E1165 Type 2 diabetes mellitus with hyperglycemia: Secondary | ICD-10-CM | POA: Diagnosis not present

## 2021-06-29 DIAGNOSIS — E785 Hyperlipidemia, unspecified: Secondary | ICD-10-CM | POA: Diagnosis not present

## 2021-06-29 DIAGNOSIS — F17200 Nicotine dependence, unspecified, uncomplicated: Secondary | ICD-10-CM | POA: Diagnosis not present

## 2021-08-18 ENCOUNTER — Ambulatory Visit
Admission: EM | Admit: 2021-08-18 | Discharge: 2021-08-18 | Disposition: A | Discharge status: Home / Self Care | Payer: BC Managed Care – PPO | Attending: Family Medicine | Admitting: Family Medicine

## 2021-08-18 ENCOUNTER — Encounter: Payer: Self-pay | Admitting: Emergency Medicine

## 2021-08-18 DIAGNOSIS — J069 Acute upper respiratory infection, unspecified: Secondary | ICD-10-CM | POA: Diagnosis not present

## 2021-08-18 DIAGNOSIS — J020 Streptococcal pharyngitis: Secondary | ICD-10-CM | POA: Insufficient documentation

## 2021-08-18 LAB — GROUP A STREP BY PCR: Group A Strep by PCR: DETECTED — AB

## 2021-08-18 MED ORDER — IPRATROPIUM BROMIDE 0.06 % NA SOLN: 2.0000 | Freq: Four times a day (QID) | NASAL | 12 refills | Status: DC

## 2021-08-18 MED ORDER — AMOXICILLIN-POT CLAVULANATE 875-125 MG PO TABS: 1.0000 | ORAL_TABLET | Freq: Two times a day (BID) | ORAL | 0 refills | Status: AC

## 2021-08-18 NOTE — ED Provider Notes (Signed)
[facility]    CSN: [id number] Arrival date & time: 08/18/21  1153      History   Chief Complaint Chief Complaint  Patient presents with   Sore Throat    HPI Jacob Coleman is a 56 y.o. male.   HPI  56 year-old male here for evaluation of upper respiratory symptoms.  Patient reports that for last 3 days he has been experiencing nasal congestion with clear nasal discharge, sore throat, ear pain, and painful swallowing.  He states his wife has exact same symptoms.  He denies any fever or cough.  Past Medical History:  Diagnosis Date   Allergy    Cancer [facility])    Moh's surgery on face   Diabetes mellitus without complication ([place])    Frozen shoulder    left   Hypertension     Patient Active Problem List   Diagnosis Date Noted   Acute calculous cholecystitis 05/15/2020   Essential hypertension 06/29/2018   Adhesive capsulitis of left shoulder 06/26/2017   DDD (degenerative disc disease), lumbar 03/16/2014   Low back pain 03/16/2014   Lumbar radicular pain 03/16/2014   Superior glenoid labrum lesion 02/10/2014   Aftercare following surgery for neoplasm 01/20/2012   Basal cell carcinoma 12/09/2011   ED (erectile dysfunction) 10/08/2011   Dyslipidemia 08/06/2011   Diabetes mellitus without complication ([place]) 66/29/4765   Insomnia 05/07/2011    Past Surgical History:  Procedure Laterality Date   CHOLECYSTECTOMY  05/16/2020       Home Medications    Prior to Admission medications   Medication Sig Start Date End Date Taking? Authorizing Provider  amoxicillin-clavulanate (AUGMENTIN) 875-125 MG tablet Take 1 tablet by mouth every 12 (twelve) hours for 10 days. 08/18/21 08/28/21 Yes Margarette Canada, NP  atorvastatin (LIPITOR) 10 MG tablet Take 1 tablet (10 mg total) by mouth daily. 06/27/21  Yes Juline Patch, MD  glimepiride (AMARYL) 2 MG tablet Take 1 tablet (2 mg total) by mouth daily before breakfast. Patient taking differently: Take 2 mg by mouth  daily before breakfast. solum 06/29/18  Yes Otilio Miu C, MD  ipratropium (ATROVENT) 0.06 % nasal spray Place 2 sprays into both nostrils 4 (four) times daily. 08/18/21  Yes Margarette Canada, NP  lisinopril (ZESTRIL) 10 MG tablet Take 1 tablet (10 mg total) by mouth daily. 06/27/21  Yes Juline Patch, MD  metFORMIN (GLUCOPHAGE) 1000 MG tablet Take 1 tablet (1,000 mg total) by mouth 2 (two) times daily. Patient taking differently: Take 1,000 mg by mouth 2 (two) times daily. solum 05/18/18  Yes Juline Patch, MD  ibuprofen (ADVIL) 600 MG tablet Take 1 tablet (600 mg total) by mouth every 6 (six) hours as needed. 05/18/20   Tylene Fantasia, PA-C    Family History Family History  Adopted: Yes    Social History Social History   Tobacco Use   Smoking status: Every Day    Types: E-cigarettes   Smokeless tobacco: Never   Tobacco comments:    wants info on quiting  Vaping Use   Vaping Use: Never used  Substance Use Topics   Alcohol use: Yes   Drug use: Never     Allergies   Patient has no known allergies.   Review of Systems Review of Systems  Constitutional:  Negative for fever.  HENT:  Positive for congestion, ear pain, rhinorrhea and sore throat.   Respiratory:  Negative for cough.   Hematological: Negative.   Psychiatric/Behavioral: Negative.  Physical Exam Triage Vital Signs ED Triage Vitals  Enc Vitals Group     BP 08/18/21 1207 118/72     Pulse Rate 08/18/21 1207 80     Resp 08/18/21 1207 15     Temp 08/18/21 1207 98.2 F (36.8 C)     Temp Source 08/18/21 1207 Oral     SpO2 08/18/21 1207 98 %     Weight 08/18/21 1206 215 lb (97.5 kg)     Height 08/18/21 1206 6' (1.829 m)     Head Circumference --      Peak Flow --      Pain Score 08/18/21 1205 5     Pain Loc --      Pain Edu? --      Excl. in [place]? --    No data found.  Updated Vital Signs BP 118/72 (BP Location: Left Arm)   Pulse 80   Temp 98.2 F (36.8 C) (Oral)   Resp 15   Ht 6' (1.829 m)    Wt 215 lb (97.5 kg)   SpO2 98%   BMI 29.16 kg/m   Visual Acuity Right Eye Distance:   Left Eye Distance:   Bilateral Distance:    Right Eye Near:   Left Eye Near:    Bilateral Near:     Physical Exam Vitals and nursing note reviewed.  Constitutional:      Appearance: Normal appearance. He is not ill-appearing.  HENT:     Head: Normocephalic and atraumatic.     Right Ear: Tympanic membrane, ear canal and external ear normal. There is no impacted cerumen.     Left Ear: Tympanic membrane, ear canal and external ear normal. There is no impacted cerumen.     Nose: Congestion and rhinorrhea present.     Mouth/Throat:     Mouth: Mucous membranes are moist.     Pharynx: Oropharynx is clear. Posterior oropharyngeal erythema present. No oropharyngeal exudate.  Cardiovascular:     Rate and Rhythm: Normal rate and regular rhythm.     Pulses: Normal pulses.     Heart sounds: Normal heart sounds. No murmur heard.    No friction rub. No gallop.  Pulmonary:     Effort: Pulmonary effort is normal.     Breath sounds: Normal breath sounds. No wheezing, rhonchi or rales.  Musculoskeletal:     Cervical back: Normal range of motion and neck supple.  Lymphadenopathy:     Cervical: No cervical adenopathy.  Skin:    General: Skin is warm and dry.     Capillary Refill: Capillary refill takes less than 2 seconds.     Findings: No erythema or rash.  Neurological:     General: No focal deficit present.     Mental Status: He is alert and oriented to person, place, and time.  Psychiatric:        Mood and Affect: Mood normal.        Behavior: Behavior normal.        Thought Content: Thought content normal.        Judgment: Judgment normal.      UC Treatments / Results  Labs (all labs ordered are listed, but only abnormal results are displayed) Labs Reviewed  GROUP A STREP BY PCR - Abnormal; Notable for the following components:      Result Value   Group A Strep by PCR DETECTED (*)    All  other components within normal limits    EKG  Radiology No results found.  Procedures Procedures (including critical care time)  Medications Ordered in UC Medications - No data to display  Initial Impression / Assessment and Plan / UC Course  I have reviewed the triage vital signs and the nursing notes.  Pertinent labs & imaging results that were available during my care of the patient were reviewed by me and considered in my medical decision making (see chart for details).  Patient is a very pleasant, nontoxic-appearing 4 old male here for evaluation of upper respiratory symptoms as outlined in HPI above.  His physical exam reveals pearly-gray tympanic membranes bilaterally with normal light reflex and clear external auditory canals.  Nasal mucosa is erythematous and edematous with dried yellow discharge in both nares.  Oropharyngeal exam reveals erythema of the soft palate and bilateral tonsillar pillars no exudate appreciated.  Posterior oropharynx has mild erythema as well.  No cervical lymphadenopathy appreciable exam.  Cardiopulmonary exam feels S1-S2 heart sounds with regular rate and rhythm and lung sounds are clear to auscultation all fields.  Will check strep PCR.  Strep PCR is positive.  I will discharge patient home on Augmentin twice daily for 10 days for treatment of his strep throat.  I will also prescribe Atrovent nasal spray to help with his nasal congestion and postnasal drip.  Final Clinical Impressions(s) / UC Diagnoses   Final diagnoses:  Strep pharyngitis  Upper respiratory tract infection, unspecified type     Discharge Instructions      Take the Augmentin twice daily for 10 days for treatment of your strep throat.  Gargle with warm salt water 2-3 times a day to soothe your throat, aid in pain relief, and aid in healing.  Take over-the-counter ibuprofen according to the package instructions as needed for pain.  You can also use Chloraseptic or  Sucrets lozenges, 1 lozenge every 2 hours as needed for throat pain.  Use the Atrovent nasal spray, 2 squirts up each nostril every 6 hours, as needed for nasal congestion and postnasal drip.  If you develop any new or worsening symptoms return for reevaluation.      ED Prescriptions     Medication Sig Dispense Auth. Provider   amoxicillin-clavulanate (AUGMENTIN) 875-125 MG tablet Take 1 tablet by mouth every 12 (twelve) hours for 10 days. 20 tablet Margarette Canada, NP   ipratropium (ATROVENT) 0.06 % nasal spray Place 2 sprays into both nostrils 4 (four) times daily. 15 mL Margarette Canada, NP      PDMP not reviewed this encounter.   Margarette Canada, NP 08/18/21 1235

## 2021-08-18 NOTE — Discharge Instructions (Addendum)
Take the Augmentin twice daily for 10 days for treatment of your strep throat.  Gargle with warm salt water 2-3 times a day to soothe your throat, aid in pain relief, and aid in healing.  Take over-the-counter ibuprofen according to the package instructions as needed for pain.  You can also use Chloraseptic or Sucrets lozenges, 1 lozenge every 2 hours as needed for throat pain.  Use the Atrovent nasal spray, 2 squirts up each nostril every 6 hours, as needed for nasal congestion and postnasal drip.  If you develop any new or worsening symptoms return for reevaluation.

## 2021-08-18 NOTE — ED Triage Notes (Signed)
Patient c/o sore throat, nasal congestion and pain when swallowing that started on Thursday.  Patient denies fevers.

## 2021-08-27 LAB — HM DIABETES EYE EXAM

## 2021-10-10 DIAGNOSIS — Z86018 Personal history of other benign neoplasm: Secondary | ICD-10-CM | POA: Diagnosis not present

## 2021-10-10 DIAGNOSIS — Z872 Personal history of diseases of the skin and subcutaneous tissue: Secondary | ICD-10-CM | POA: Diagnosis not present

## 2021-10-10 DIAGNOSIS — Z85828 Personal history of other malignant neoplasm of skin: Secondary | ICD-10-CM | POA: Diagnosis not present

## 2021-10-10 DIAGNOSIS — L578 Other skin changes due to chronic exposure to nonionizing radiation: Secondary | ICD-10-CM | POA: Diagnosis not present

## 2021-11-02 DIAGNOSIS — E1169 Type 2 diabetes mellitus with other specified complication: Secondary | ICD-10-CM | POA: Diagnosis not present

## 2021-11-02 DIAGNOSIS — E1159 Type 2 diabetes mellitus with other circulatory complications: Secondary | ICD-10-CM | POA: Diagnosis not present

## 2021-11-02 DIAGNOSIS — E1165 Type 2 diabetes mellitus with hyperglycemia: Secondary | ICD-10-CM | POA: Diagnosis not present

## 2021-11-02 DIAGNOSIS — E785 Hyperlipidemia, unspecified: Secondary | ICD-10-CM | POA: Diagnosis not present

## 2021-11-02 LAB — HM DIABETES FOOT EXAM: HM Diabetic Foot Exam: NORMAL

## 2021-11-02 LAB — MICROALBUMIN / CREATININE URINE RATIO: Microalb Creat Ratio: <12

## 2021-11-02 LAB — HEMOGLOBIN A1C: Hemoglobin A1C: 6.4

## 2021-12-26 ENCOUNTER — Other Ambulatory Visit: Payer: Self-pay

## 2021-12-26 ENCOUNTER — Other Ambulatory Visit: Payer: Self-pay | Admitting: Family Medicine

## 2021-12-26 DIAGNOSIS — E785 Hyperlipidemia, unspecified: Secondary | ICD-10-CM

## 2021-12-26 DIAGNOSIS — I10 Essential (primary) hypertension: Secondary | ICD-10-CM

## 2021-12-26 NOTE — Telephone Encounter (Signed)
Requested medication (s) are due for refill today:   Yes for both  Requested medication (s) are on the active medication list:   Yes for both  Future visit scheduled:   Yes 01/21/2022   Last ordered: Both on 5/10/203 #90, 1 refill each  Returned because labs are due for both.      Requested Prescriptions  Pending Prescriptions Disp Refills   atorvastatin (LIPITOR) 10 MG tablet [Pharmacy Med Name: ATORVASTATIN 10 MG TAB[*]] 90 tablet 1    Sig: TAKE ONE TABLET BY MOUTH ONE TIME DAILY     Cardiovascular:  Antilipid - Statins Failed - 12/26/2021  2:16 PM      Failed - Lipid Panel in normal range within the last 12 months    Cholesterol, Total  Date Value Ref Range Status  01/28/2020 141 100 - 199 mg/dL Final   Cholesterol  Date Value Ref Range Status  06/30/2020 127 0 - 200 Final   LDL Chol Calc (NIH)  Date Value Ref Range Status  01/28/2020 78 0 - 99 mg/dL Final   LDL Cholesterol  Date Value Ref Range Status  06/30/2020 61  Final   LDL Direct  Date Value Ref Range Status  06/29/2018 102 (H) 0 - 99 mg/dL Final   HDL  Date Value Ref Range Status  06/30/2020 38 35 - 70 Final  01/28/2020 35 (L) >39 mg/dL Final   Triglycerides  Date Value Ref Range Status  06/30/2020 141 40 - 160 Final         Passed - Patient is not pregnant      Passed - Valid encounter within last 12 months    Recent Outpatient Visits           6 months ago Essential hypertension   [place] [facility] [place], Deanna C, MD   1 year ago Essential hypertension   [place] [facility] [place], Deanna C, MD   1 year ago Annual physical exam   [place] [facility] [place], Deanna C, MD   2 years ago Annual physical exam   [place] [facility], MD   3 years ago Essential hypertension   [place] [facility], MD       Future Appointments             In 3 weeks Juline Patch, MD [facility] at [place], [facility]             lisinopril (ZESTRIL) 10 MG tablet [Pharmacy Med Name: LISINOPRIL 10 MG TAB[*]] 90 tablet 1    Sig: TAKE ONE TABLET BY MOUTH ONE TIME DAILY     Cardiovascular:  ACE Inhibitors Failed - 12/26/2021  2:16 PM      Failed - Cr in normal range and within 180 days    Creatinine  Date Value Ref Range Status  06/30/2020 0.9 0.6 - 1.3 Final   Creatinine, Ser  Date Value Ref Range Status  05/17/2020 0.97 0.61 - 1.24 mg/dL Final         Failed - K in normal range and within 180 days    Potassium  Date Value Ref Range Status  05/17/2020 4.4 3.5 - 5.1 mmol/L Final  Failed - Valid encounter within last 6 months    Recent Outpatient Visits           6 months ago Essential hypertension   [place] [facility] [place], Deanna C, MD   1 year ago Essential hypertension   [place] [facility] [place], Deanna C, MD   1 year ago Annual physical exam   [place] [facility] [place], Deanna C, MD   2 years ago Annual physical exam   [place] [facility] [place], Deanna C, MD   3 years ago Essential hypertension   [place] [facility] [place], [place], MD       Future Appointments             In 3 weeks Juline Patch, MD [facility] at [facility] [place] - Patient is not pregnant      Passed - Last BP in normal range    BP Readings from Last 1 Encounters:  08/18/21 118/72

## 2021-12-28 ENCOUNTER — Ambulatory Visit: Payer: BC Managed Care – PPO | Admitting: Family Medicine

## 2022-01-21 ENCOUNTER — Ambulatory Visit (INDEPENDENT_AMBULATORY_CARE_PROVIDER_SITE_OTHER): Payer: BC Managed Care – PPO | Admitting: Family Medicine

## 2022-01-21 ENCOUNTER — Encounter: Payer: Self-pay | Admitting: Family Medicine

## 2022-01-21 VITALS — BP 104/70 | HR 98 | Ht 72.0 in | Wt 201.0 lb

## 2022-01-21 DIAGNOSIS — Z23 Encounter for immunization: Secondary | ICD-10-CM

## 2022-01-21 DIAGNOSIS — F329 Major depressive disorder, single episode, unspecified: Secondary | ICD-10-CM

## 2022-01-21 DIAGNOSIS — I10 Essential (primary) hypertension: Secondary | ICD-10-CM | POA: Diagnosis not present

## 2022-01-21 DIAGNOSIS — E785 Hyperlipidemia, unspecified: Secondary | ICD-10-CM | POA: Diagnosis not present

## 2022-01-21 MED ORDER — ATORVASTATIN CALCIUM 10 MG PO TABS: 10.0000 mg | ORAL_TABLET | Freq: Every day | ORAL | 1 refills | Status: DC

## 2022-01-21 MED ORDER — SERTRALINE HCL 25 MG PO TABS: 25.0000 mg | ORAL_TABLET | Freq: Every day | ORAL | 3 refills | Status: DC

## 2022-01-21 MED ORDER — LISINOPRIL 5 MG PO TABS: 5.0000 mg | ORAL_TABLET | Freq: Every day | ORAL | 3 refills | Status: DC

## 2022-01-21 NOTE — Progress Notes (Signed)
Date:  01/21/2022   Name:  Jacob Coleman   DOB:  1966-02-03   MRN:  [id number]   Chief Complaint: Hypertension, Hyperlipidemia, Flu Vaccine, and Depression (Lost wife this year/ first holidays- can't sleep and is depressed 8 and 5)  Hypertension This is a chronic problem. The problem has been gradually improving since onset. The problem is controlled. Associated symptoms include palpitations. Pertinent negatives include no anxiety, blurred vision, chest pain, headaches, malaise/fatigue, neck pain, orthopnea, peripheral edema, PND, shortness of breath or sweats. There are no associated agents to hypertension. Risk factors for coronary artery disease include dyslipidemia. Past treatments include ACE inhibitors. The current treatment provides moderate improvement. There are no compliance problems.  There is no history of angina, kidney disease, CAD/MI, CVA, heart failure, left ventricular hypertrophy, PVD or retinopathy. There is no history of chronic renal disease, a hypertension causing med or renovascular disease.  Hyperlipidemia This is a chronic problem. The current episode started more than 1 year ago. The problem is controlled. Recent lipid tests were reviewed and are normal. He has no history of chronic renal disease, diabetes, hypothyroidism, liver disease, obesity or nephrotic syndrome. There are no known factors aggravating his hyperlipidemia. Pertinent negatives include no chest pain, myalgias or shortness of breath. The current treatment provides moderate improvement of lipids. There are no compliance problems.   Depression        This is a new problem.  Associated symptoms include fatigue, insomnia, decreased interest and sad.  Associated symptoms include no decreased concentration, no helplessness, no hopelessness, not irritable, no restlessness, no myalgias, no headaches and no suicidal ideas.  Past treatments include nothing.   Pertinent negatives include no hypothyroidism and no  anxiety.   Lab Results  Component Value Date   NA 133 (L) 05/17/2020   K 4.4 05/17/2020   CO2 14 (L) 05/17/2020   GLUCOSE 118 (H) 05/17/2020   BUN 15 06/30/2020   CREATININE 0.9 06/30/2020   CALCIUM 8.7 (L) 05/17/2020   GFRNONAA >60 05/17/2020   Lab Results  Component Value Date   CHOL 127 06/30/2020   HDL 38 06/30/2020   LDLCALC 61 06/30/2020   LDLDIRECT 102 (H) 06/29/2018   TRIG 141 06/30/2020   CHOLHDL 4.0 08/19/2018   No results found for: "TSH" Lab Results  Component Value Date   HGBA1C 6.4 11/02/2021   Lab Results  Component Value Date   WBC 16.2 (H) 05/17/2020   HGB 16.3 05/17/2020   HCT 46.0 05/17/2020   MCV 93.5 05/17/2020   PLT 228 05/17/2020   Lab Results  Component Value Date   ALT 21 06/30/2020   AST 6 (A) 06/30/2020   AST 60 (A) 06/30/2020   AST 6 (A) 06/30/2020   ALKPHOS 67 05/17/2020   BILITOT 1.5 (H) 05/17/2020   No results found for: "25OHVITD2", "25OHVITD3", "VD25OH"   Review of Systems  Constitutional:  Positive for fatigue. Negative for chills, fever and malaise/fatigue.  HENT:  Negative for drooling, ear discharge, ear pain and sore throat.   Eyes:  Negative for blurred vision.  Respiratory:  Negative for cough, chest tightness, shortness of breath and wheezing.   Cardiovascular:  Positive for palpitations. Negative for chest pain, orthopnea, leg swelling and PND.  Gastrointestinal:  Negative for abdominal pain and nausea.  Endocrine: Negative for polydipsia.  Genitourinary:  Negative for dysuria, frequency, hematuria and urgency.  Musculoskeletal:  Negative for myalgias and neck pain.  Skin:  Negative for rash.  Allergic/Immunologic: Negative for  environmental allergies.  Neurological:  Positive for dizziness. Negative for headaches.  Hematological:  Negative for adenopathy. Does not bruise/bleed easily.  Psychiatric/Behavioral:  Positive for depression. Negative for decreased concentration and suicidal ideas. The patient has  insomnia. The patient is not nervous/anxious.     Patient Active Problem List   Diagnosis Date Noted   Acute calculous cholecystitis 05/15/2020   Essential hypertension 06/29/2018   Adhesive capsulitis of left shoulder 06/26/2017   DDD (degenerative disc disease), lumbar 03/16/2014   Low back pain 03/16/2014   Lumbar radicular pain 03/16/2014   Superior glenoid labrum lesion 02/10/2014   Aftercare following surgery for neoplasm 01/20/2012   Basal cell carcinoma 12/09/2011   ED (erectile dysfunction) 10/08/2011   Dyslipidemia 08/06/2011   Diabetes mellitus without complication ([place]) 02/72/5366   Insomnia 05/07/2011    No Known Allergies  Past Surgical History:  Procedure Laterality Date   CHOLECYSTECTOMY  05/16/2020    Social History   Tobacco Use   Smoking status: Every Day    Types: E-cigarettes   Smokeless tobacco: Never   Tobacco comments:    wants info on quiting  Vaping Use   Vaping Use: Never used  Substance Use Topics   Alcohol use: Yes   Drug use: Never     Medication list has been reviewed and updated.  Current Meds  Medication Sig   atorvastatin (LIPITOR) 10 MG tablet TAKE ONE TABLET BY MOUTH ONE TIME DAILY   glimepiride (AMARYL) 2 MG tablet Take 1 tablet (2 mg total) by mouth daily before breakfast. (Patient taking differently: Take 2 mg by mouth daily before breakfast. solum)   lisinopril (ZESTRIL) 10 MG tablet TAKE ONE TABLET BY MOUTH ONE TIME DAILY   metFORMIN (GLUCOPHAGE) 1000 MG tablet Take 1 tablet (1,000 mg total) by mouth 2 (two) times daily. (Patient taking differently: Take 1,000 mg by mouth 2 (two) times daily. solum)   [DISCONTINUED] ipratropium (ATROVENT) 0.06 % nasal spray Place 2 sprays into both nostrils 4 (four) times daily.       01/21/2022    1:27 PM 06/27/2021   11:03 AM 08/03/2020    9:16 AM  GAD 7 : Generalized Anxiety Score  Nervous, Anxious, on Edge 1 0 0  Control/stop worrying 0 0 0  Worry too much - different things 0 0 0   Trouble relaxing 2 0 0  Restless 2 0 0  Easily annoyed or irritable 0 0 0  Afraid - awful might happen 0 0 0  Total GAD 7 Score 5 0 0  Anxiety Difficulty Somewhat difficult Not difficult at all        01/21/2022    1:26 PM 06/27/2021   11:00 AM 08/03/2020    9:15 AM  Depression screen PHQ 2/9  Decreased Interest 1 0 0  Down, Depressed, Hopeless 2 0 0  PHQ - 2 Score 3 0 0  Altered sleeping 2 0 0  Tired, decreased energy 2 1 0  Change in appetite 0 1 0  Feeling bad or failure about yourself  0 0 0  Trouble concentrating 0 0 0  Moving slowly or fidgety/restless 1 0 0  Suicidal thoughts 0 0 0  PHQ-9 Score 8 2 0  Difficult doing work/chores Not difficult at all Not difficult at all     BP Readings from Last 3 Encounters:  01/21/22 104/70  08/18/21 118/72  06/27/21 120/80    Physical Exam Vitals reviewed.  Constitutional:      General: He is  not irritable. HENT:     Head: Normocephalic.     Right Ear: External ear normal.     Left Ear: External ear normal.     Nose: Nose normal.     Mouth/Throat:     Mouth: Mucous membranes are dry.  Eyes:     General: No scleral icterus.       Right eye: No discharge.        Left eye: No discharge.     Conjunctiva/sclera: Conjunctivae normal.     Pupils: Pupils are equal, round, and reactive to light.  Neck:     Thyroid: No thyromegaly.     Vascular: No JVD.     Trachea: No tracheal deviation.  Cardiovascular:     Rate and Rhythm: Normal rate and regular rhythm.     Heart sounds: Normal heart sounds. No murmur heard.    No systolic murmur is present.     No diastolic murmur is present.     No friction rub. No gallop.  Pulmonary:     Effort: No respiratory distress.     Breath sounds: Normal breath sounds. No wheezing or rales.  Abdominal:     General: Bowel sounds are normal.     Palpations: Abdomen is soft. There is no mass.     Tenderness: There is no abdominal tenderness. There is no guarding or rebound.   Musculoskeletal:        General: No tenderness. Normal range of motion.     Cervical back: Normal range of motion and neck supple.  Lymphadenopathy:     Cervical: No cervical adenopathy.  Skin:    General: Skin is warm.     Findings: No rash.  Neurological:     Mental Status: He is alert and oriented to person, place, and time.     Cranial Nerves: No cranial nerve deficit.     Deep Tendon Reflexes: Reflexes are normal and symmetric.     Wt Readings from Last 3 Encounters:  01/21/22 201 lb (91.2 kg)  08/18/21 215 lb (97.5 kg)  06/27/21 212 lb (96.2 kg)    BP 104/70   Pulse 98   Ht 6' (1.829 m)   Wt 201 lb (91.2 kg)   SpO2 97%   BMI 27.26 kg/m   Assessment and Plan: 1. Essential hypertension Chronic.  Controlled.  Stable.  Blood pressure 104/70.  Patient is having some dizziness which may be orthostatic in nature.  Will decrease lisinopril to 5 mg once a day.  Will check renal function panel.  Will check blood pressure in 6 weeks with dosing change when we reevaluate for depression and medication effect. - Renal Function Panel - lisinopril (ZESTRIL) 5 MG tablet; Take 1 tablet (5 mg total) by mouth daily.  Dispense: 90 tablet; Refill: 3  2. Dyslipidemia Chronic.  Controlled.  Stable.  Continue atorvastatin 10 mg once a day.  Review of previous lipid panel is acceptable. - atorvastatin (LIPITOR) 10 MG tablet; Take 1 tablet (10 mg total) by mouth daily.  Dispense: 90 tablet; Refill: 1  3. Reactive depression Chronic.  Controlled.  Stable.  PHQ is 8.  GAD score is 5.  Patient is undergoing some grief reactivity status post passing of spouse.  We talked about proceeding in the direction of addressing the depression versus insomnia and that he has both nights of insomnia and hypersomnia.  Therefore we will with the patient's agreement initiate sertraline 25 mg a day and will recheck in 6 weeks.  At which time we will also recheck blood pressure - sertraline (ZOLOFT) 25 MG tablet;  Take 1 tablet (25 mg total) by mouth daily.  Dispense: 60 tablet; Refill: 3  4. Need for immunization against influenza Discussed and administered - Flu Vaccine QUAD 67moIM (Fluarix, Fluzone & Alfiuria Quad PF)     DOtilio Miu MD

## 2022-01-21 NOTE — Patient Instructions (Signed)
Mediterranean Diet ?A Mediterranean diet refers to food and lifestyle choices that are based on the traditions of countries located on the [place]. It focuses on eating more fruits, vegetables, whole grains, beans, nuts, seeds, and heart-healthy fats, and eating less dairy, meat, eggs, and processed foods with added sugar, salt, and fat. This way of eating has been shown to help prevent certain conditions and improve outcomes for people who have chronic diseases, like kidney disease and heart disease. ?What are tips for following this plan? ?Reading food labels ?Check the serving size of packaged foods. For foods such as rice and pasta, the serving size refers to the amount of cooked product, not dry. ?Check the total fat in packaged foods. Avoid foods that have saturated fat or trans fats. ?Check the ingredient list for added sugars, such as corn syrup. ?Shopping ? ?Buy a variety of foods that offer a balanced diet, including: ?Fresh fruits and vegetables (produce). ?Grains, beans, nuts, and seeds. Some of these may be available in unpackaged forms or large amounts (in bulk). ?Fresh seafood. ?Poultry and eggs. ?Low-fat dairy products. ?Buy whole ingredients instead of prepackaged foods. ?Buy fresh fruits and vegetables in-season from local farmers markets. ?Buy plain frozen fruits and vegetables. ?If you do not have access to quality fresh seafood, buy precooked frozen shrimp or canned fish, such as tuna, salmon, or sardines. ?Stock your pantry so you always have certain foods on hand, such as olive oil, canned tuna, canned tomatoes, rice, pasta, and beans. ?Cooking ?Cook foods with extra-virgin olive oil instead of using butter or other vegetable oils. ?Have meat as a side dish, and have vegetables or grains as your main dish. This means having meat in small portions or adding small amounts of meat to foods like pasta or stew. ?Use beans or vegetables instead of meat in common dishes like chili or  lasagna. ?Experiment with different cooking methods. Try roasting, broiling, steaming, and saut?ing vegetables. ?Add frozen vegetables to soups, stews, pasta, or rice. ?Add nuts or seeds for added healthy fats and plant protein at each meal. You can add these to yogurt, salads, or vegetable dishes. ?Marinate fish or vegetables using olive oil, lemon juice, garlic, and fresh herbs. ?Meal planning ?Plan to eat one vegetarian meal one day each week. Try to work up to two vegetarian meals, if possible. ?Eat seafood two or more times a week. ?Have healthy snacks readily available, such as: ?Vegetable sticks with hummus. ?Greek yogurt. ?Fruit and nut trail mix. ?Eat balanced meals throughout the week. This includes: ?Fruit: 2-3 servings a day. ?Vegetables: 4-5 servings a day. ?Low-fat dairy: 2 servings a day. ?Fish, poultry, or lean meat: 1 serving a day. ?Beans and legumes: 2 or more servings a week. ?Nuts and seeds: 1-2 servings a day. ?Whole grains: 6-8 servings a day. ?Extra-virgin olive oil: 3-4 servings a day. ?Limit red meat and sweets to only a few servings a month. ?Lifestyle ? ?Cook and eat meals together with your family, when possible. ?Drink enough fluid to keep your urine pale yellow. ?Be physically active every day. This includes: ?Aerobic exercise like running or swimming. ?Leisure activities like gardening, walking, or housework. ?Get 7-8 hours of sleep each night. ?If recommended by your health care provider, drink red wine in moderation. This means 1 glass a day for nonpregnant women and 2 glasses a day for men. A glass of wine equals 5 oz (150 mL). ?What foods should I eat? ?Fruits ?Apples. Apricots. Avocado. Berries. Bananas. Cherries. Dates.   Figs. Grapes. Lemons. Melon. Oranges. Peaches. Plums. Pomegranate. ?Vegetables ?Artichokes. Beets. Broccoli. Cabbage. Carrots. Eggplant. Green beans. Chard. Kale. Spinach. Onions. Leeks. Peas. Squash. Tomatoes. Peppers. Radishes. ?Grains ?Whole-grain pasta. Brown  rice. Bulgur wheat. Polenta. Couscous. Whole-wheat bread. Oatmeal. Quinoa. ?Meats and other proteins ?Beans. Almonds. Sunflower seeds. Pine nuts. Peanuts. Cod. Salmon. Scallops. Shrimp. Tuna. Tilapia. Clams. Oysters. Eggs. Poultry without skin. ?Dairy ?Low-fat milk. Cheese. Greek yogurt. ?Fats and oils ?Extra-virgin olive oil. Avocado oil. Grapeseed oil. ?Beverages ?Water. Red wine. Herbal tea. ?Sweets and desserts ?Greek yogurt with honey. Baked apples. Poached pears. Trail mix. ?Seasonings and condiments ?Basil. Cilantro. Coriander. Cumin. Mint. Parsley. Sage. Rosemary. Tarragon. Garlic. Oregano. Thyme. Pepper. Balsamic vinegar. Tahini. Hummus. Tomato sauce. Olives. Mushrooms. ?The items listed above may not be a complete list of foods and beverages you can eat. Contact a dietitian for more information. ?What foods should I limit? ?This is a list of foods that should be eaten rarely or only on special occasions. ?Fruits ?Fruit canned in syrup. ?Vegetables ?Deep-fried potatoes (french fries). ?Grains ?Prepackaged pasta or rice dishes. Prepackaged cereal with added sugar. Prepackaged snacks with added sugar. ?Meats and other proteins ?Beef. Pork. Lamb. Poultry with skin. Hot dogs. Bacon. ?Dairy ?Ice cream. Sour cream. Whole milk. ?Fats and oils ?Butter. Canola oil. Vegetable oil. Beef fat (tallow). Lard. ?Beverages ?Juice. Sugar-sweetened soft drinks. Beer. Liquor and spirits. ?Sweets and desserts ?Cookies. Cakes. Pies. Candy. ?Seasonings and condiments ?Mayonnaise. Pre-made sauces and marinades. ?The items listed above may not be a complete list of foods and beverages you should limit. Contact a dietitian for more information. ?Summary ?The Mediterranean diet includes both food and lifestyle choices. ?Eat a variety of fresh fruits and vegetables, beans, nuts, seeds, and whole grains. ?Limit the amount of red meat and sweets that you eat. ?If recommended by your health care provider, drink red wine in moderation.  This means 1 glass a day for nonpregnant women and 2 glasses a day for men. A glass of wine equals 5 oz (150 mL). ?This information is not intended to replace advice given to you by your health care provider. Make sure you discuss any questions you have with your health care provider. ?Document Revised: 03/12/2019 Document Reviewed: 01/07/2019 ?[employer]vier Patient Education ? 2023 [employer] ? ?

## 2022-01-22 LAB — RENAL FUNCTION PANEL
Albumin: 4.9 g/dL (ref 3.8–4.9)
BUN/Creatinine Ratio: 19 (ref 9–20)
BUN: 18 mg/dL (ref 6–24)
CO2: 21 mmol/L (ref 20–29)
Calcium: 10 mg/dL (ref 8.7–10.2)
Chloride: 98 mmol/L (ref 96–106)
Creatinine, Ser: 0.94 mg/dL (ref 0.76–1.27)
Glucose: 117 mg/dL — ABNORMAL HIGH (ref 70–99)
Phosphorus: 3.6 mg/dL (ref 2.8–4.1)
Potassium: 4.7 mmol/L (ref 3.5–5.2)
Sodium: 137 mmol/L (ref 134–144)
eGFR: 95 mL/min/{1.73_m2} (ref >59)

## 2022-03-04 ENCOUNTER — Ambulatory Visit: Payer: BC Managed Care – PPO | Admitting: Family Medicine

## 2022-03-07 ENCOUNTER — Ambulatory Visit: Payer: BC Managed Care – PPO | Admitting: Family Medicine

## 2022-03-13 ENCOUNTER — Encounter: Payer: Self-pay | Admitting: Family Medicine

## 2022-03-13 ENCOUNTER — Ambulatory Visit (INDEPENDENT_AMBULATORY_CARE_PROVIDER_SITE_OTHER): Payer: BC Managed Care – PPO | Admitting: Family Medicine

## 2022-03-13 VITALS — BP 120/78 | HR 78 | Ht 72.0 in | Wt 206.0 lb

## 2022-03-13 DIAGNOSIS — F329 Major depressive disorder, single episode, unspecified: Secondary | ICD-10-CM | POA: Diagnosis not present

## 2022-03-13 DIAGNOSIS — Z23 Encounter for immunization: Secondary | ICD-10-CM | POA: Diagnosis not present

## 2022-03-13 MED ORDER — SERTRALINE HCL 50 MG PO TABS: 50.0000 mg | ORAL_TABLET | Freq: Every day | ORAL | 1 refills | Status: DC

## 2022-03-13 NOTE — Progress Notes (Signed)
Date:  03/13/2022   Name:  Jacob Coleman   DOB:  11/25/1965   MRN:  [id number]   Chief Complaint: Depression (Follow up on sertraline- started '25mg'$ - wants to go up to next dosing PHQ went from 8-2 and GAD 5-1) and Covid Vaccine   Depression        This is a chronic problem.  The current episode started more than 1 year ago.   The onset quality is sudden.   The problem occurs intermittently.  The problem has been gradually improving since onset.  Associated symptoms include fatigue and hopelessness.  Associated symptoms include no decreased concentration, no helplessness, does not have insomnia, not irritable, no restlessness, no decreased interest, no appetite change, no body aches, no myalgias, no headaches, no indigestion, not sad and no suicidal ideas.     The symptoms are aggravated by nothing.  Past treatments include SSRIs - Selective serotonin reuptake inhibitors.  Compliance with treatment is variable.  Previous treatment provided mild relief.  Past medical history includes anxiety.   Anxiety Presents for follow-up visit. Symptoms include nervous/anxious behavior. Patient reports no chest pain, compulsions, confusion, decreased concentration, depressed mood, dizziness, dry mouth, excessive worry, feeling of choking, hyperventilation, impotence, insomnia, irritability, malaise, muscle tension, nausea, obsessions, palpitations, panic, restlessness, shortness of breath or suicidal ideas. The severity of symptoms is moderate. The quality of sleep is good.      Lab Results  Component Value Date   NA 137 01/21/2022   K 4.7 01/21/2022   CO2 21 01/21/2022   GLUCOSE 117 (H) 01/21/2022   BUN 18 01/21/2022   CREATININE 0.94 01/21/2022   CALCIUM 10.0 01/21/2022   EGFR 95 01/21/2022   GFRNONAA >60 05/17/2020   Lab Results  Component Value Date   CHOL 127 06/30/2020   HDL 38 06/30/2020   LDLCALC 61 06/30/2020   LDLDIRECT 102 (H) 06/29/2018   TRIG 141 06/30/2020   CHOLHDL 4.0  08/19/2018   No results found for: "TSH" Lab Results  Component Value Date   HGBA1C 6.4 11/02/2021   Lab Results  Component Value Date   WBC 16.2 (H) 05/17/2020   HGB 16.3 05/17/2020   HCT 46.0 05/17/2020   MCV 93.5 05/17/2020   PLT 228 05/17/2020   Lab Results  Component Value Date   ALT 21 06/30/2020   AST 6 (A) 06/30/2020   AST 60 (A) 06/30/2020   AST 6 (A) 06/30/2020   ALKPHOS 67 05/17/2020   BILITOT 1.5 (H) 05/17/2020   No results found for: "25OHVITD2", "25OHVITD3", "VD25OH"   Review of Systems  Constitutional:  Positive for fatigue. Negative for appetite change, chills, fever and irritability.  HENT:  Negative for ear discharge and ear pain.   Respiratory:  Negative for cough, shortness of breath and wheezing.   Cardiovascular:  Negative for chest pain, palpitations and leg swelling.  Gastrointestinal:  Negative for abdominal pain, blood in stool, constipation, diarrhea and nausea.  Endocrine: Negative for polydipsia.  Genitourinary:  Negative for dysuria, frequency, hematuria, impotence and urgency.  Musculoskeletal:  Negative for back pain, myalgias and neck pain.  Skin:  Negative for rash.  Allergic/Immunologic: Negative for environmental allergies.  Neurological:  Negative for dizziness and headaches.  Hematological:  Does not bruise/bleed easily.  Psychiatric/Behavioral:  Positive for depression. Negative for confusion, decreased concentration and suicidal ideas. The patient is nervous/anxious. The patient does not have insomnia.     Patient Active Problem List   Diagnosis Date Noted  Acute calculous cholecystitis 05/15/2020   Essential hypertension 06/29/2018   Adhesive capsulitis of left shoulder 06/26/2017   DDD (degenerative disc disease), lumbar 03/16/2014   Low back pain 03/16/2014   Lumbar radicular pain 03/16/2014   Superior glenoid labrum lesion 02/10/2014   Aftercare following surgery for neoplasm 01/20/2012   Basal cell carcinoma 12/09/2011    ED (erectile dysfunction) 10/08/2011   Dyslipidemia 08/06/2011   Diabetes mellitus without complication ([place]) 17/61/6073   Insomnia 05/07/2011    No Known Allergies  Past Surgical History:  Procedure Laterality Date   CHOLECYSTECTOMY  05/16/2020    Social History   Tobacco Use   Smoking status: Every Day    Types: E-cigarettes   Smokeless tobacco: Never   Tobacco comments:    wants info on quiting  Vaping Use   Vaping Use: Never used  Substance Use Topics   Alcohol use: Yes   Drug use: Never     Medication list has been reviewed and updated.  Current Meds  Medication Sig   atorvastatin (LIPITOR) 10 MG tablet Take 1 tablet (10 mg total) by mouth daily.   glimepiride (AMARYL) 2 MG tablet Take 1 tablet (2 mg total) by mouth daily before breakfast. (Patient taking differently: Take 2 mg by mouth daily before breakfast. solum)   lisinopril (ZESTRIL) 5 MG tablet Take 1 tablet (5 mg total) by mouth daily.   metFORMIN (GLUCOPHAGE) 1000 MG tablet Take 1 tablet (1,000 mg total) by mouth 2 (two) times daily. (Patient taking differently: Take 1,000 mg by mouth 2 (two) times daily. solum)   sertraline (ZOLOFT) 25 MG tablet Take 1 tablet (25 mg total) by mouth daily.       03/13/2022   10:58 AM 01/21/2022    1:27 PM 06/27/2021   11:03 AM 08/03/2020    9:16 AM  GAD 7 : Generalized Anxiety Score  Nervous, Anxious, on Edge 0 1 0 0  Control/stop worrying 0 0 0 0  Worry too much - different things 0 0 0 0  Trouble relaxing 1 2 0 0  Restless 0 2 0 0  Easily annoyed or irritable 0 0 0 0  Afraid - awful might happen 0 0 0 0  Total GAD 7 Score 1 5 0 0  Anxiety Difficulty Not difficult at all Somewhat difficult Not difficult at all        03/13/2022   10:57 AM 01/21/2022    1:26 PM 06/27/2021   11:00 AM  Depression screen PHQ 2/9  Decreased Interest 0 1 0  Down, Depressed, Hopeless 1 2 0  PHQ - 2 Score 1 3 0  Altered sleeping 0 2 0  Tired, decreased energy '1 2 1  '$ Change in  appetite 0 0 1  Feeling bad or failure about yourself  0 0 0  Trouble concentrating 0 0 0  Moving slowly or fidgety/restless 0 1 0  Suicidal thoughts 0 0 0  PHQ-9 Score '2 8 2  '$ Difficult doing work/chores Not difficult at all Not difficult at all Not difficult at all    BP Readings from Last 3 Encounters:  03/13/22 120/78  01/21/22 104/70  08/18/21 118/72    Physical Exam Vitals and nursing note reviewed.  Constitutional:      General: He is not irritable. HENT:     Head: Normocephalic.     Right Ear: Tympanic membrane and external ear normal.     Left Ear: Tympanic membrane and external ear normal.  Nose: Nose normal.     Mouth/Throat:     Mouth: Mucous membranes are moist.     Pharynx: No oropharyngeal exudate or posterior oropharyngeal erythema.  Eyes:     General: No scleral icterus.       Right eye: No discharge.        Left eye: No discharge.     Conjunctiva/sclera: Conjunctivae normal.     Pupils: Pupils are equal, round, and reactive to light.  Neck:     Thyroid: No thyromegaly.     Vascular: No JVD.     Trachea: No tracheal deviation.  Cardiovascular:     Rate and Rhythm: Normal rate and regular rhythm.     Heart sounds: Normal heart sounds. No murmur heard.    No friction rub. No gallop.  Pulmonary:     Effort: No respiratory distress.     Breath sounds: Normal breath sounds. No wheezing, rhonchi or rales.  Abdominal:     General: Bowel sounds are normal.     Palpations: Abdomen is soft. There is no mass.     Tenderness: There is no abdominal tenderness. There is no guarding or rebound.  Musculoskeletal:        General: No tenderness. Normal range of motion.     Cervical back: Normal range of motion and neck supple.  Lymphadenopathy:     Cervical: No cervical adenopathy.  Skin:    General: Skin is warm.     Findings: No rash.  Neurological:     Mental Status: He is alert and oriented to person, place, and time.     Cranial Nerves: No cranial nerve  deficit.     Deep Tendon Reflexes: Reflexes are normal and symmetric.     Wt Readings from Last 3 Encounters:  03/13/22 206 lb (93.4 kg)  01/21/22 201 lb (91.2 kg)  08/18/21 215 lb (97.5 kg)    BP 120/78   Pulse 78   Ht 6' (1.829 m)   Wt 206 lb (93.4 kg)   SpO2 99%   BMI 27.94 kg/m   Assessment and Plan:  1. Reactive depression New onset.  Placed on medication last visit 25 mg sertraline has been tolerated well and PHQ score has decreased to 2 with significant improvement and patient would like to continue but increase to 50 mg and we will do so.  GAD score has improved to 1 as well and we will recheck patient in 6 months. - sertraline (ZOLOFT) 50 MG tablet; Take 1 tablet (50 mg total) by mouth daily.  Dispense: 90 tablet; Refill: 1  2. Need for COVID-19 vaccine Discussed and administered [employer] Fall 2023 Covid-19 Vaccine 78yr and older    DOtilio Miu MD

## 2022-03-15 ENCOUNTER — Encounter: Payer: Self-pay | Admitting: Family Medicine

## 2022-03-15 LAB — HM DIABETES EYE EXAM

## 2022-04-30 DIAGNOSIS — E1169 Type 2 diabetes mellitus with other specified complication: Secondary | ICD-10-CM | POA: Diagnosis not present

## 2022-04-30 DIAGNOSIS — E785 Hyperlipidemia, unspecified: Secondary | ICD-10-CM | POA: Diagnosis not present

## 2022-04-30 DIAGNOSIS — E1165 Type 2 diabetes mellitus with hyperglycemia: Secondary | ICD-10-CM | POA: Diagnosis not present

## 2022-04-30 DIAGNOSIS — E1159 Type 2 diabetes mellitus with other circulatory complications: Secondary | ICD-10-CM | POA: Diagnosis not present

## 2022-04-30 LAB — HEMOGLOBIN A1C: Hemoglobin A1C: 6.8

## 2022-06-24 ENCOUNTER — Ambulatory Visit
Admission: RE | Admit: 2022-06-24 | Discharge: 2022-06-24 | Disposition: A | Discharge status: Home / Self Care | Payer: BC Managed Care – PPO | Source: Ambulatory Visit | Attending: Physician Assistant | Admitting: Physician Assistant

## 2022-06-24 VITALS — BP 132/86 | HR 73 | Temp 98.4°F | Resp 16

## 2022-06-24 DIAGNOSIS — J029 Acute pharyngitis, unspecified: Secondary | ICD-10-CM

## 2022-06-24 LAB — GROUP A STREP BY PCR: Group A Strep by PCR: NOT DETECTED

## 2022-06-24 NOTE — ED Triage Notes (Signed)
Pt c/o sore throat x 5 days. Pt has tried OTC medication for symptoms with no relief.

## 2022-06-24 NOTE — ED Provider Notes (Signed)
[facility]    CSN: [id number] Arrival date & time: 06/24/22  0955      History   Chief Complaint Chief Complaint  Patient presents with   Sore Throat    Feels like I have strep throat. - Entered by patient    HPI Jacob Coleman is a 57 y.o. male presenting for approximately 5-day history of sore throat and congestion.  Denies fever, cough, breathing difficulty or wheezing, vomiting or diarrhea.  Reports he believes he has strep.  Symptoms of gotten a lot better but he says his throat still hurts pretty badly.  Tried OTC meds without relief.  HPI  Past Medical History:  Diagnosis Date   Allergy    Cancer [facility])    Moh's surgery on face   Diabetes mellitus without complication (HCC)    Frozen shoulder    left   Hypertension     Patient Active Problem List   Diagnosis Date Noted   Acute calculous cholecystitis 05/15/2020   Essential hypertension 06/29/2018   Adhesive capsulitis of left shoulder 06/26/2017   DDD (degenerative disc disease), lumbar 03/16/2014   Low back pain 03/16/2014   Lumbar radicular pain 03/16/2014   Superior glenoid labrum lesion 02/10/2014   Aftercare following surgery for neoplasm 01/20/2012   Basal cell carcinoma 12/09/2011   ED (erectile dysfunction) 10/08/2011   Dyslipidemia 08/06/2011   Diabetes mellitus without complication (HCC) 05/07/2011   Insomnia 05/07/2011    Past Surgical History:  Procedure Laterality Date   CHOLECYSTECTOMY  05/16/2020       Home Medications    Prior to Admission medications   Medication Sig Start Date End Date Taking? Authorizing Provider  atorvastatin (LIPITOR) 10 MG tablet Take 1 tablet (10 mg total) by mouth daily. 01/21/22  Yes Duanne Limerick, MD  glimepiride (AMARYL) 2 MG tablet Take 1 tablet (2 mg total) by mouth daily before breakfast. Patient taking differently: Take 2 mg by mouth daily before breakfast. solum 06/29/18  Yes Duanne Limerick, MD  lisinopril (ZESTRIL) 5 MG tablet Take  1 tablet (5 mg total) by mouth daily. 01/21/22  Yes Duanne Limerick, MD  metFORMIN (GLUCOPHAGE) 1000 MG tablet Take 1 tablet (1,000 mg total) by mouth 2 (two) times daily. Patient taking differently: Take 1,000 mg by mouth 2 (two) times daily. solum 05/18/18  Yes Duanne Limerick, MD  JARDIANCE 25 MG TABS tablet Take 25 mg by mouth daily.    [provider]  sertraline (ZOLOFT) 50 MG tablet Take 1 tablet (50 mg total) by mouth daily. 03/13/22   Duanne Limerick, MD    Family History Family History  Adopted: Yes    Social History Social History   Tobacco Use   Smoking status: Former   Smokeless tobacco: Never   Tobacco comments:    wants info on quiting  Vaping Use   Vaping Use: Every day  Substance Use Topics   Alcohol use: Yes   Drug use: Never     Allergies   Patient has no known allergies.   Review of Systems Review of Systems  Constitutional:  Negative for fatigue and fever.  HENT:  Positive for congestion, rhinorrhea and sore throat. Negative for sinus pressure and sinus pain.   Respiratory:  Negative for cough and shortness of breath.   Gastrointestinal:  Negative for abdominal pain, diarrhea, nausea and vomiting.  Musculoskeletal:  Negative for myalgias.  Neurological:  Negative for weakness, light-headedness and headaches.  Hematological:  Negative  for adenopathy.     Physical Exam Triage Vital Signs ED Triage Vitals  Enc Vitals Group     BP      Pulse      Resp      Temp      Temp src      SpO2      Weight      Height      Head Circumference      Peak Flow      Pain Score      Pain Loc      Pain Edu?      Excl. in GC?    No data found.  Updated Vital Signs BP 132/86 (BP Location: Right Arm)   Pulse 73   Temp 98.4 F (36.9 C) (Oral)   Resp 16   SpO2 97%       Physical Exam Vitals and nursing note reviewed.  Constitutional:      General: He is not in acute distress.    Appearance: Normal appearance. He is well-developed. He is  not ill-appearing.  HENT:     Head: Normocephalic and atraumatic.     Right Ear: Tympanic membrane, ear canal and external ear normal.     Left Ear: Tympanic membrane, ear canal and external ear normal.     Nose: Congestion present.     Mouth/Throat:     Mouth: Mucous membranes are moist.     Pharynx: Oropharynx is clear. Posterior oropharyngeal erythema present.  Eyes:     General: No scleral icterus.    Conjunctiva/sclera: Conjunctivae normal.  Cardiovascular:     Rate and Rhythm: Normal rate and regular rhythm.     Heart sounds: Normal heart sounds.  Pulmonary:     Effort: Pulmonary effort is normal. No respiratory distress.     Breath sounds: Normal breath sounds.  Musculoskeletal:     Cervical back: Neck supple.  Skin:    General: Skin is warm and dry.     Capillary Refill: Capillary refill takes less than 2 seconds.  Neurological:     General: No focal deficit present.     Mental Status: He is alert. Mental status is at baseline.     Motor: No weakness.     Gait: Gait normal.  Psychiatric:        Mood and Affect: Mood normal.        Behavior: Behavior normal.      UC Treatments / Results  Labs (all labs ordered are listed, but only abnormal results are displayed) Labs Reviewed  GROUP A STREP BY PCR    EKG   Radiology No results found.  Procedures Procedures (including critical care time)  Medications Ordered in UC Medications - No data to display  Initial Impression / Assessment and Plan / UC Course  I have reviewed the triage vital signs and the nursing notes.  Pertinent labs & imaging results that were available during my care of the patient were reviewed by me and considered in my medical decision making (see chart for details).   57 year old male presents for sore throat and congestion x 5 days.  Concern for strep.  History of recurrent strep.  Vitals normal stable and he is overall well-appearing.  Nasal congestion present as well as mild to  moderate erythema and mild swelling of posterior pharynx.  Exam otherwise normal.  PCR strep negative.  Discussed with patient.  Viral pharyngitis.  Advised use of OTC c-Met such as ibuprofen, Tylenol,  throat lozenges, Chloraseptic spray.  Reviewed return precautions.   Final Clinical Impressions(s) / UC Diagnoses   Final diagnoses:  Viral pharyngitis     Discharge Instructions      URI/COLD SYMPTOMS: Your exam today is consistent with a viral illness. Antibiotics are not indicated at this time. Use medications as directed, including cough syrup, nasal saline, and decongestants. Your symptoms should improve over the next few days and resolve within 7-10 days. Increase rest and fluids. F/u if symptoms worsen or predominate such as sore throat, ear pain, productive cough, shortness of breath, or if you develop high fevers or worsening fatigue over the next several days.       ED Prescriptions   None    PDMP not reviewed this encounter.   Shirlee Latch, PA-C 06/24/22 1132

## 2022-06-24 NOTE — Discharge Instructions (Addendum)
URI/COLD SYMPTOMS: Your exam today is consistent with a viral illness. Antibiotics are not indicated at this time. Use medications as directed, including cough syrup, nasal saline, and decongestants. Your symptoms should improve over the next few days and resolve within 7-10 days. Increase rest and fluids. F/u if symptoms worsen or predominate such as sore throat, ear pain, productive cough, shortness of breath, or if you develop high fevers or worsening fatigue over the next several days.    

## 2022-07-23 ENCOUNTER — Other Ambulatory Visit: Payer: Self-pay | Admitting: Family Medicine

## 2022-07-23 ENCOUNTER — Ambulatory Visit: Payer: BC Managed Care – PPO | Admitting: Family Medicine

## 2022-07-23 DIAGNOSIS — E785 Hyperlipidemia, unspecified: Secondary | ICD-10-CM

## 2022-07-23 NOTE — Telephone Encounter (Signed)
Requested medications are due for refill today.  yes  Requested medications are on the active medications list.  yes  Last refill. 01/21/2022 #90 1 rf  Future visit scheduled.   yes  Notes to clinic.  Labs are expired.    Requested Prescriptions  Pending Prescriptions Disp Refills   atorvastatin (LIPITOR) 10 MG tablet [Pharmacy Med Name: ATORVASTATIN 10 MG TAB[*]] 90 tablet 1    Sig: TAKE ONE TABLET BY MOUTH ONE TIME DAILY     Cardiovascular:  Antilipid - Statins Failed - 07/23/2022  3:46 PM      Failed - Lipid Panel in normal range within the last 12 months    Cholesterol, Total  Date Value Ref Range Status  01/28/2020 141 100 - 199 mg/dL Final   Cholesterol  Date Value Ref Range Status  06/30/2020 127 0 - 200 Final   LDL Chol Calc (NIH)  Date Value Ref Range Status  01/28/2020 78 0 - 99 mg/dL Final   LDL Cholesterol  Date Value Ref Range Status  06/30/2020 61  Final   LDL Direct  Date Value Ref Range Status  06/29/2018 102 (H) 0 - 99 mg/dL Final   HDL  Date Value Ref Range Status  06/30/2020 38 35 - 70 Final  01/28/2020 35 (L) >39 mg/dL Final   Triglycerides  Date Value Ref Range Status  06/30/2020 141 40 - 160 Final         Passed - Patient is not pregnant      Passed - Valid encounter within last 12 months    Recent Outpatient Visits           4 months ago Reactive depression   [facility]   6 months ago Essential hypertension   [facility]   1 year ago Essential hypertension   [facility]   1 year ago Essential hypertension   [place] [facility]   2 years ago Annual physical exam   [facility]        Future Appointments             In 1 week Duanne Limerick, MD [facility]

## 2022-07-31 IMAGING — US US ABDOMEN LIMITED RUQ/ASCITES
1 series · 14 of 25 positions shown · non-contrast
Comparison: None.

CLINICAL DATA: Increased severity chronic right upper quadrant
pain.

EXAM:
ULTRASOUND ABDOMEN LIMITED RIGHT UPPER QUADRANT

[Series 1: us abdomen limited ruq (liver/gb) · 14 of 60 slices shown]
[im 1/60]
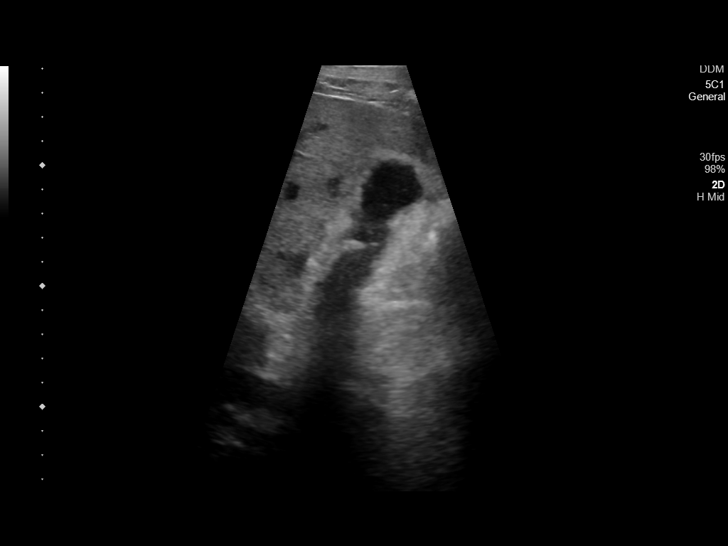
[im 5/60]
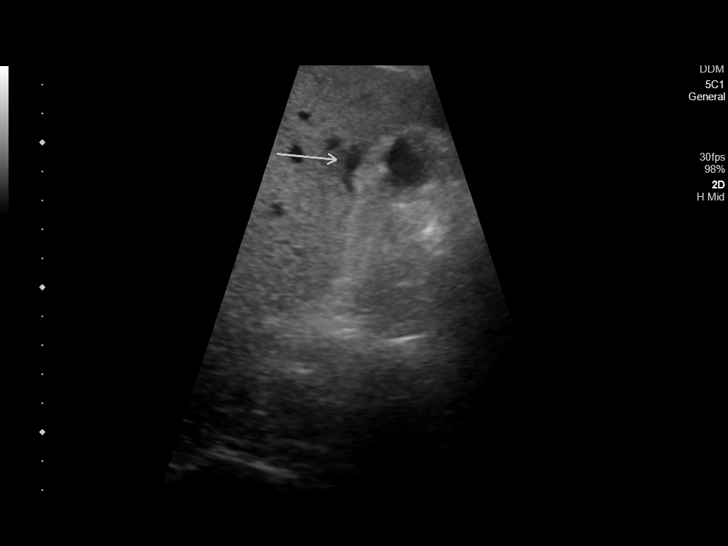
[im 10/60]
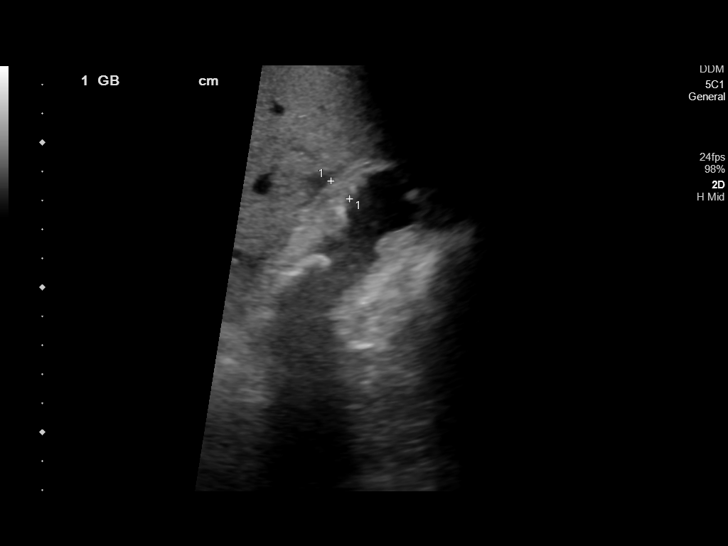
[im 15/60]
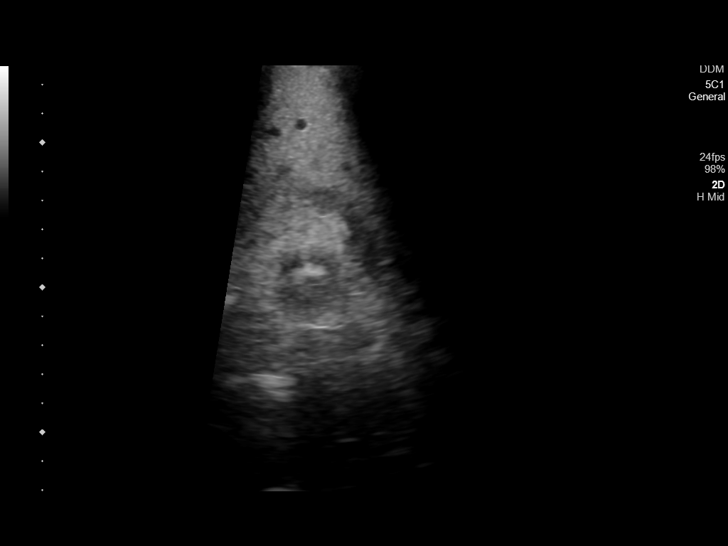
[im 20/60]
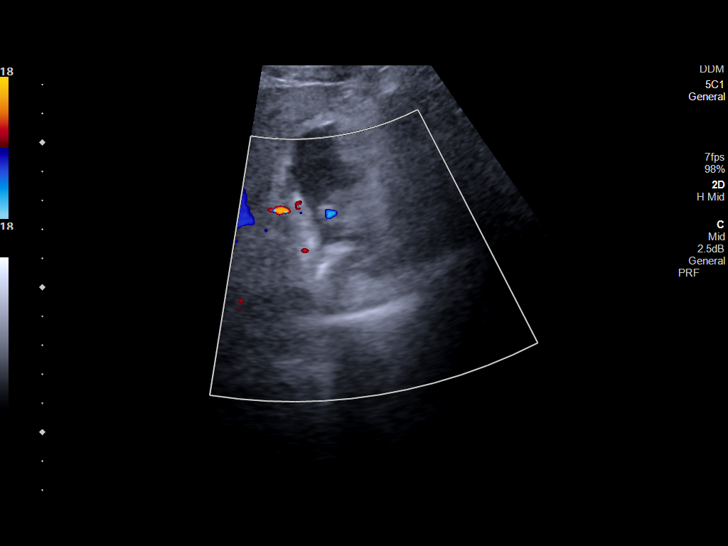
[im 23/60]
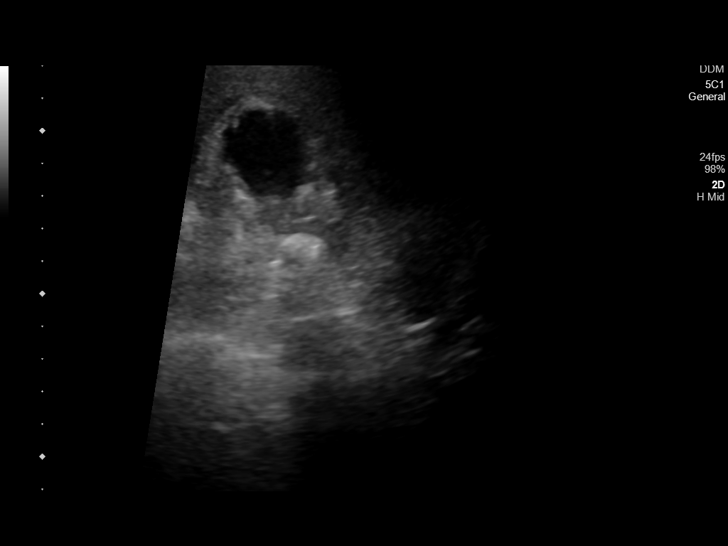
[im 28/60]
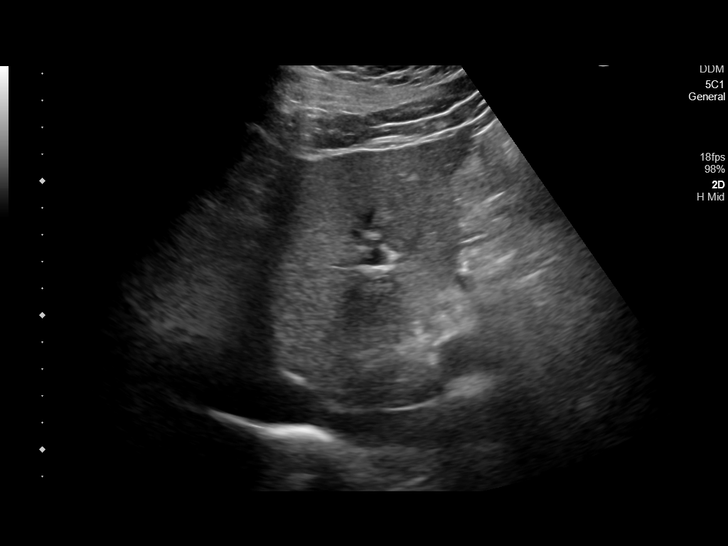
[im 32/60]
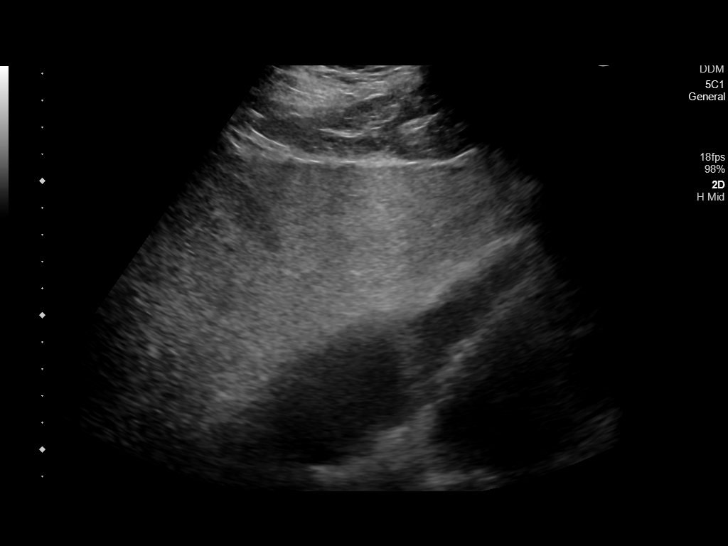
[im 37/60]
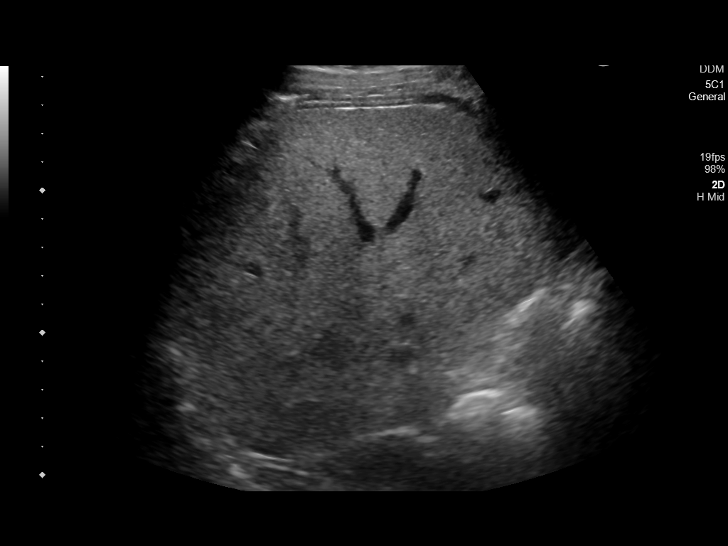
[im 40/60]
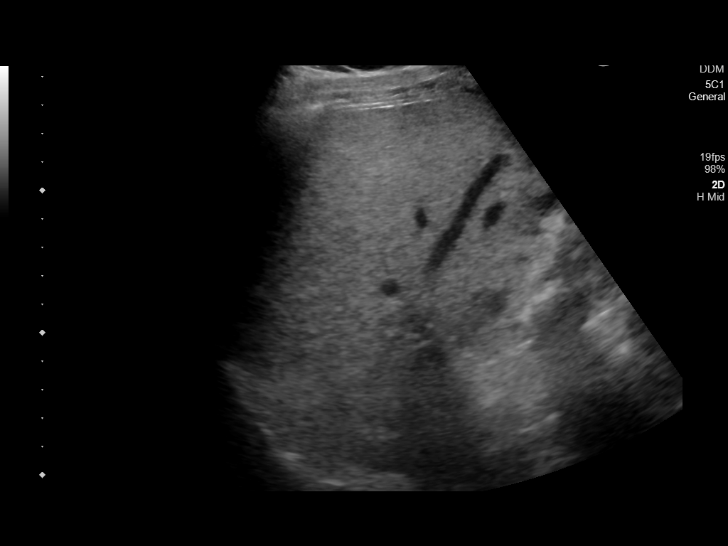
[im 45/60]
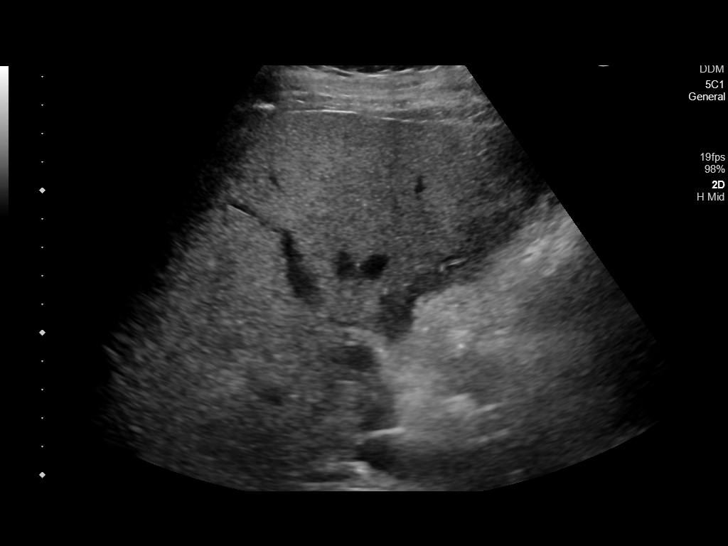
[im 50/60]
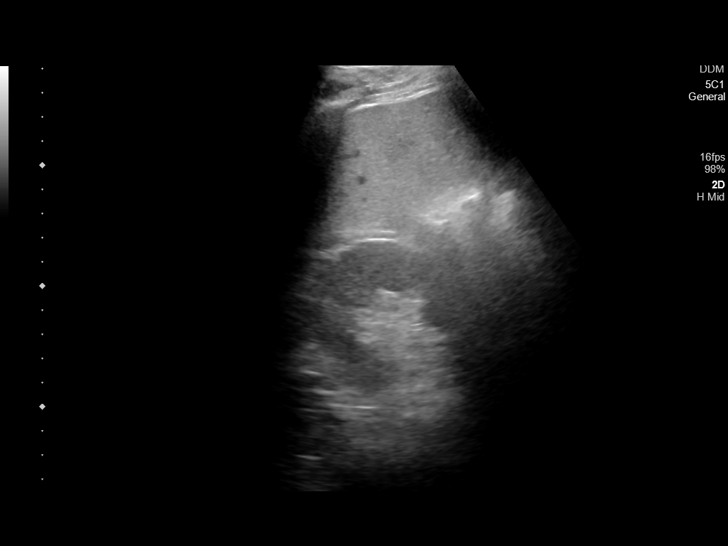
[im 55/60]
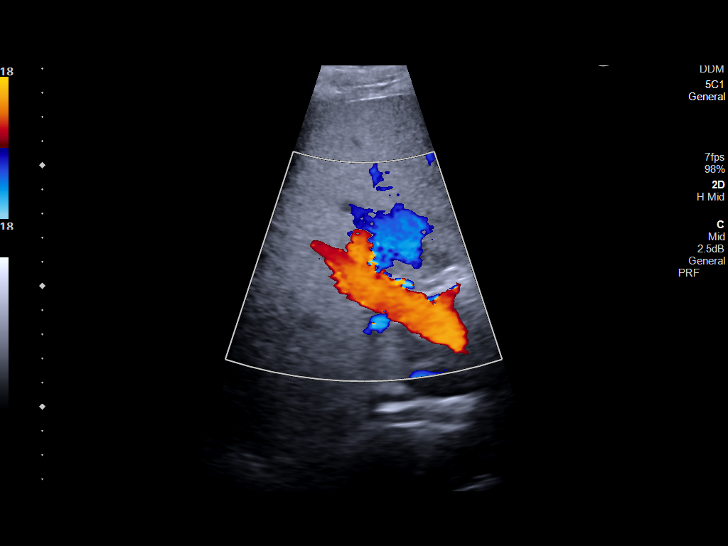
[im 60/60]
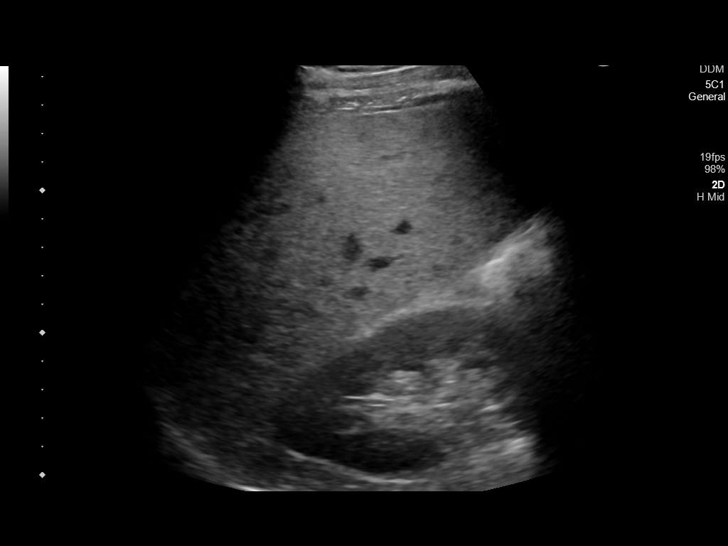

[14 of 25 positions shown; findings below may reference images not displayed]

FINDINGS: Gallbladder:

Cholelithiasis measuring up to 2 cm. Biliary sludge. Trace
pericholecystic fluid. Gallbladder wall thickening measuring up to 9
mm. Sonographic Murphy sign noted by sonographer.

Common bile duct:

Diameter: 4 mm

Liver:

No focal lesion identified. Diffusely increased parenchymal
echogenicity. Portal vein is patent on color Doppler imaging with
normal direction of blood flow towards the liver.

Other: None.
IMPRESSION: 1. Cholelithiasis with sonographic findings suggestive of acute
cholecystitis.
2. The echogenicity of the liver is increased. This is a nonspecific
finding but is most commonly seen with fatty infiltration of the
liver. There are no obvious focal liver lesions.

## 2022-08-05 ENCOUNTER — Ambulatory Visit: Payer: BC Managed Care – PPO | Admitting: Family Medicine

## 2022-08-06 ENCOUNTER — Encounter: Payer: Self-pay | Admitting: Family Medicine

## 2022-08-06 ENCOUNTER — Ambulatory Visit (INDEPENDENT_AMBULATORY_CARE_PROVIDER_SITE_OTHER): Payer: BC Managed Care – PPO | Admitting: Family Medicine

## 2022-08-06 VITALS — BP 124/78 | HR 88 | Ht 72.0 in | Wt 213.0 lb

## 2022-08-06 DIAGNOSIS — I10 Essential (primary) hypertension: Secondary | ICD-10-CM

## 2022-08-06 DIAGNOSIS — E785 Hyperlipidemia, unspecified: Secondary | ICD-10-CM | POA: Diagnosis not present

## 2022-08-06 DIAGNOSIS — H1013 Acute atopic conjunctivitis, bilateral: Secondary | ICD-10-CM | POA: Diagnosis not present

## 2022-08-06 MED ORDER — ATORVASTATIN CALCIUM 10 MG PO TABS: 10.0000 mg | ORAL_TABLET | Freq: Every day | ORAL | 0 refills | Status: DC

## 2022-08-06 MED ORDER — ATORVASTATIN CALCIUM 10 MG PO TABS: 10.0000 mg | ORAL_TABLET | Freq: Every day | ORAL | 1 refills | Status: DC

## 2022-08-06 MED ORDER — LISINOPRIL 5 MG PO TABS
5.0000 mg | ORAL_TABLET | Freq: Every day | ORAL | 1 refills | Status: DC
Start: 2022-08-06 — End: 2023-02-04

## 2022-08-06 MED ORDER — LISINOPRIL 5 MG PO TABS: 5.0000 mg | ORAL_TABLET | Freq: Every day | ORAL | 3 refills | Status: DC

## 2022-08-06 NOTE — Progress Notes (Addendum)
Date:  08/06/2022   Name:  Jacob Coleman   DOB:  22-Nov-1965   MRN:  [id number]   Chief Complaint: Hypertension and Hyperlipidemia  Hypertension This is a chronic problem. The current episode started more than 1 year ago. The problem has been gradually improving since onset. The problem is controlled. Pertinent negatives include no anxiety, blurred vision, chest pain, headaches, malaise/fatigue, neck pain, orthopnea, palpitations, peripheral edema, PND, shortness of breath or sweats. There are no associated agents to hypertension. There are no known risk factors for coronary artery disease. Past treatments include ACE inhibitors. There are no compliance problems.  There is no history of angina, CAD/MI or CVA. There is no history of chronic renal disease, a hypertension causing med or renovascular disease.  Hyperlipidemia This is a chronic problem. The current episode started more than 1 year ago. The problem is controlled. Recent lipid tests were reviewed and are normal. He has no history of chronic renal disease, diabetes, hypothyroidism, liver disease, obesity or nephrotic syndrome. Pertinent negatives include no chest pain, myalgias or shortness of breath. Current antihyperlipidemic treatment includes statins. The current treatment provides moderate improvement of lipids. There are no compliance problems.  Risk factors for coronary artery disease include dyslipidemia.    Lab Results  Component Value Date   NA 137 01/21/2022   K 4.7 01/21/2022   CO2 21 01/21/2022   GLUCOSE 117 (H) 01/21/2022   BUN 18 01/21/2022   CREATININE 0.94 01/21/2022   CALCIUM 10.0 01/21/2022   EGFR 95 01/21/2022   GFRNONAA >60 05/17/2020   Lab Results  Component Value Date   CHOL 127 06/30/2020   HDL 38 06/30/2020   LDLCALC 61 06/30/2020   LDLDIRECT 102 (H) 06/29/2018   TRIG 141 06/30/2020   CHOLHDL 4.0 08/19/2018   No results found for: "TSH" Lab Results  Component Value Date   HGBA1C 6.8  04/30/2022   Lab Results  Component Value Date   WBC 16.2 (H) 05/17/2020   HGB 16.3 05/17/2020   HCT 46.0 05/17/2020   MCV 93.5 05/17/2020   PLT 228 05/17/2020   Lab Results  Component Value Date   ALT 21 06/30/2020   AST 6 (A) 06/30/2020   AST 60 (A) 06/30/2020   AST 6 (A) 06/30/2020   ALKPHOS 67 05/17/2020   BILITOT 1.5 (H) 05/17/2020   No results found for: "25OHVITD2", "25OHVITD3", "VD25OH"   Review of Systems  Constitutional:  Negative for fever, malaise/fatigue and unexpected weight change.  HENT:  Negative for congestion, hearing loss, postnasal drip, rhinorrhea, sinus pressure, sinus pain, sneezing and sore throat.   Eyes:  Negative for blurred vision, photophobia and pain.  Respiratory:  Negative for cough, chest tightness, shortness of breath and wheezing.   Cardiovascular:  Negative for chest pain, palpitations, orthopnea and PND.  Gastrointestinal:  Negative for abdominal pain, blood in stool and constipation.  Endocrine: Negative for polydipsia and polyuria.  Musculoskeletal:  Negative for myalgias and neck pain.  Neurological:  Negative for headaches.    Patient Active Problem List   Diagnosis Date Noted   Acute calculous cholecystitis 05/15/2020   Essential hypertension 06/29/2018   Adhesive capsulitis of left shoulder 06/26/2017   DDD (degenerative disc disease), lumbar 03/16/2014   Low back pain 03/16/2014   Lumbar radicular pain 03/16/2014   Superior glenoid labrum lesion 02/10/2014   Aftercare following surgery for neoplasm 01/20/2012   Basal cell carcinoma 12/09/2011   ED (erectile dysfunction) 10/08/2011   Dyslipidemia 08/06/2011  Diabetes mellitus without complication (HCC) 05/07/2011   Insomnia 05/07/2011    No Known Allergies  Past Surgical History:  Procedure Laterality Date   CHOLECYSTECTOMY  05/16/2020    Social History   Tobacco Use   Smoking status: Former   Smokeless tobacco: Never   Tobacco comments:    wants info on  quiting  Vaping Use   Vaping Use: Every day  Substance Use Topics   Alcohol use: Yes   Drug use: Never     Medication list has been reviewed and updated.  Current Meds  Medication Sig   atorvastatin (LIPITOR) 10 MG tablet TAKE ONE TABLET BY MOUTH ONE TIME DAILY   glimepiride (AMARYL) 2 MG tablet Take 1 tablet (2 mg total) by mouth daily before breakfast. (Patient taking differently: Take 2 mg by mouth daily before breakfast. solum)   JARDIANCE 25 MG TABS tablet Take 25 mg by mouth daily.   lisinopril (ZESTRIL) 5 MG tablet Take 1 tablet (5 mg total) by mouth daily.   metFORMIN (GLUCOPHAGE) 1000 MG tablet Take 1 tablet (1,000 mg total) by mouth 2 (two) times daily. (Patient taking differently: Take 1,000 mg by mouth 2 (two) times daily. solum)       08/06/2022    3:43 PM 03/13/2022   10:58 AM 01/21/2022    1:27 PM 06/27/2021   11:03 AM  GAD 7 : Generalized Anxiety Score  Nervous, Anxious, on Edge 0 0 1 0  Control/stop worrying 0 0 0 0  Worry too much - different things 0 0 0 0  Trouble relaxing 0 1 2 0  Restless 0 0 2 0  Easily annoyed or irritable 0 0 0 0  Afraid - awful might happen 0 0 0 0  Total GAD 7 Score 0 1 5 0  Anxiety Difficulty Not difficult at all Not difficult at all Somewhat difficult Not difficult at all       08/06/2022    3:43 PM 03/13/2022   10:57 AM 01/21/2022    1:26 PM  Depression screen PHQ 2/9  Decreased Interest 0 0 1  Down, Depressed, Hopeless 0 1 2  PHQ - 2 Score 0 1 3  Altered sleeping 0 0 2  Tired, decreased energy 0 1 2  Change in appetite 0 0 0  Feeling bad or failure about yourself  0 0 0  Trouble concentrating 0 0 0  Moving slowly or fidgety/restless 0 0 1  Suicidal thoughts 0 0 0  PHQ-9 Score 0 2 8  Difficult doing work/chores Not difficult at all Not difficult at all Not difficult at all    BP Readings from Last 3 Encounters:  08/06/22 124/78  06/24/22 132/86  03/13/22 120/78    Physical Exam Vitals and nursing note reviewed.   HENT:     Head: Normocephalic.     Right Ear: Tympanic membrane, ear canal and external ear normal.     Left Ear: Tympanic membrane, ear canal and external ear normal.     Nose: Nose normal.     Mouth/Throat:     Mouth: Mucous membranes are moist.  Eyes:     General: No scleral icterus.       Right eye: No discharge.        Left eye: No discharge.     Conjunctiva/sclera: Conjunctivae normal.     Pupils: Pupils are equal, round, and reactive to light.     Comments: Lower conjunctive mild erythema to suggest an allergic conjunctivitis.  Neck:     Thyroid: No thyromegaly.     Vascular: No JVD.     Trachea: No tracheal deviation.  Cardiovascular:     Rate and Rhythm: Normal rate and regular rhythm.     Heart sounds: Normal heart sounds. No murmur heard.    No friction rub. No gallop.  Pulmonary:     Effort: No respiratory distress.     Breath sounds: Normal breath sounds. No wheezing, rhonchi or rales.  Abdominal:     General: Bowel sounds are normal.     Palpations: Abdomen is soft. There is no mass.     Tenderness: There is no abdominal tenderness. There is no guarding or rebound.  Musculoskeletal:        General: No tenderness. Normal range of motion.     Cervical back: Normal range of motion and neck supple.  Lymphadenopathy:     Cervical: No cervical adenopathy.  Skin:    General: Skin is warm.     Findings: No rash.  Neurological:     Mental Status: He is alert and oriented to person, place, and time.     Cranial Nerves: No cranial nerve deficit.     Coordination: Coordination normal.     Deep Tendon Reflexes: Reflexes are normal and symmetric.     Wt Readings from Last 3 Encounters:  08/06/22 213 lb (96.6 kg)  03/13/22 206 lb (93.4 kg)  01/21/22 201 lb (91.2 kg)    BP 124/78   Pulse 88   Ht 6' (1.829 m)   Wt 213 lb (96.6 kg)   SpO2 97%   BMI 28.89 kg/m   Assessment and Plan:  1. Dyslipidemia Chronic.  Controlled.  Stable.  Continue atorvastatin 10 mg  once a day will recheck lipid panel. - atorvastatin (LIPITOR) 10 MG tablet; Take 1 tablet (10 mg total) by mouth daily.  Dispense: 90 tablet; Refill: 0 - Lipid Panel With LDL/HDL Ratio  2. Essential hypertension Chronic.  Controlled.  Stable.  Blood pressure 124/78.  Asymptomatic.  Tolerating medication well.  Continue lisinopril 5 mg once a day.  Will recheck in 6 months. - lisinopril (ZESTRIL) 5 MG tablet; Take 1 tablet (5 mg total) by mouth daily.  Dispense: 90 tablet; Refill: 3 - Renal Function Panel   3.  Bilateral allergic conjunctivitis patient has been instructed to continue over-the-counter eyedrops currently using. Elizabeth Sauer, MD

## 2022-08-12 ENCOUNTER — Telehealth: Payer: Self-pay

## 2022-08-12 NOTE — Telephone Encounter (Signed)
Called pt and left message to come in for labs

## 2022-08-13 DIAGNOSIS — E785 Hyperlipidemia, unspecified: Secondary | ICD-10-CM | POA: Diagnosis not present

## 2022-08-13 DIAGNOSIS — I10 Essential (primary) hypertension: Secondary | ICD-10-CM | POA: Diagnosis not present

## 2022-08-14 LAB — RENAL FUNCTION PANEL
Albumin: 4.7 g/dL (ref 3.8–4.9)
BUN/Creatinine Ratio: 19 (ref 9–20)
BUN: 17 mg/dL (ref 6–24)
CO2: 23 mmol/L (ref 20–29)
Calcium: 9.6 mg/dL (ref 8.7–10.2)
Chloride: 103 mmol/L (ref 96–106)
Creatinine, Ser: 0.88 mg/dL (ref 0.76–1.27)
Glucose: 152 mg/dL — ABNORMAL HIGH (ref 70–99)
Phosphorus: 3.2 mg/dL (ref 2.8–4.1)
Potassium: 4.4 mmol/L (ref 3.5–5.2)
Sodium: 139 mmol/L (ref 134–144)
eGFR: 100 mL/min/{1.73_m2} (ref >59)

## 2022-08-14 LAB — LIPID PANEL WITH LDL/HDL RATIO
Cholesterol, Total: 131 mg/dL (ref 100–199)
HDL: 43 mg/dL (ref >39)
LDL Chol Calc (NIH): 65 mg/dL (ref 0–99)
LDL/HDL Ratio: 1.5 ratio (ref 0.0–3.6)
Triglycerides: 128 mg/dL (ref 0–149)
VLDL Cholesterol Cal: 23 mg/dL (ref 5–40)

## 2022-12-11 DIAGNOSIS — R5383 Other fatigue: Secondary | ICD-10-CM | POA: Diagnosis not present

## 2022-12-11 DIAGNOSIS — E1159 Type 2 diabetes mellitus with other circulatory complications: Secondary | ICD-10-CM | POA: Diagnosis not present

## 2022-12-11 DIAGNOSIS — E1169 Type 2 diabetes mellitus with other specified complication: Secondary | ICD-10-CM | POA: Diagnosis not present

## 2022-12-11 DIAGNOSIS — Z79899 Other long term (current) drug therapy: Secondary | ICD-10-CM | POA: Diagnosis not present

## 2022-12-11 DIAGNOSIS — E785 Hyperlipidemia, unspecified: Secondary | ICD-10-CM | POA: Diagnosis not present

## 2022-12-11 DIAGNOSIS — I152 Hypertension secondary to endocrine disorders: Secondary | ICD-10-CM | POA: Diagnosis not present

## 2022-12-11 DIAGNOSIS — E1165 Type 2 diabetes mellitus with hyperglycemia: Secondary | ICD-10-CM | POA: Diagnosis not present

## 2023-02-04 ENCOUNTER — Encounter: Payer: Self-pay | Admitting: Family Medicine

## 2023-02-04 ENCOUNTER — Ambulatory Visit: Payer: 59 | Admitting: Family Medicine

## 2023-02-04 VITALS — BP 128/78 | HR 88 | Ht 72.0 in | Wt 209.0 lb

## 2023-02-04 DIAGNOSIS — Z23 Encounter for immunization: Secondary | ICD-10-CM | POA: Diagnosis not present

## 2023-02-04 DIAGNOSIS — I10 Essential (primary) hypertension: Secondary | ICD-10-CM

## 2023-02-04 DIAGNOSIS — E785 Hyperlipidemia, unspecified: Secondary | ICD-10-CM | POA: Diagnosis not present

## 2023-02-04 MED ORDER — LISINOPRIL 5 MG PO TABS: 5.0000 mg | ORAL_TABLET | Freq: Every day | ORAL | 1 refills | Status: DC

## 2023-02-04 MED ORDER — ATORVASTATIN CALCIUM 10 MG PO TABS: 10.0000 mg | ORAL_TABLET | Freq: Every day | ORAL | 1 refills | Status: AC

## 2023-02-04 NOTE — Progress Notes (Signed)
Date:  02/04/2023   Name:  Jacob Coleman   DOB:  [birth year]   MRN:  [id number]   Chief Complaint: Hypertension and Hyperlipidemia  Hypertension This is a chronic problem. The current episode started more than 1 year ago. The problem has been gradually improving since onset. The problem is controlled. Pertinent negatives include no anxiety, blurred vision, chest pain, headaches, malaise/fatigue, neck pain, orthopnea, palpitations, peripheral edema, PND, shortness of breath or sweats. There are no associated agents to hypertension. There are no known risk factors for coronary artery disease. Past treatments include ACE inhibitors. The current treatment provides moderate improvement. There are no compliance problems.  There is no history of CAD/MI or CVA. There is no history of chronic renal disease, a hypertension causing med or renovascular disease.  Hyperlipidemia This is a chronic problem. The current episode started more than 1 year ago. The problem is controlled. Recent lipid tests were reviewed and are normal. He has no history of chronic renal disease, diabetes, hypothyroidism, liver disease, obesity or nephrotic syndrome. Pertinent negatives include no chest pain, focal sensory loss, focal weakness, leg pain, myalgias or shortness of breath. Current antihyperlipidemic treatment includes statins. The current treatment provides moderate improvement of lipids. There are no compliance problems.     Lab Results  Component Value Date   NA 139 08/13/2022   K 4.4 08/13/2022   CO2 23 08/13/2022   GLUCOSE 152 (H) 08/13/2022   BUN 17 08/13/2022   CREATININE 0.88 08/13/2022   CALCIUM 9.6 08/13/2022   EGFR 100 08/13/2022   GFRNONAA >60 05/17/2020   Lab Results  Component Value Date   CHOL 131 08/13/2022   HDL 43 08/13/2022   LDLCALC 65 08/13/2022   LDLDIRECT 102 (H) 06/29/2018   TRIG 128 08/13/2022   CHOLHDL 4.0 08/19/2018   No results found for: "TSH" Lab Results  Component  Value Date   HGBA1C 6.8 04/30/2022   Lab Results  Component Value Date   WBC 16.2 (H) 05/17/2020   HGB 16.3 05/17/2020   HCT 46.0 05/17/2020   MCV 93.5 05/17/2020   PLT 228 05/17/2020   Lab Results  Component Value Date   ALT 21 06/30/2020   AST 6 (A) 06/30/2020   AST 60 (A) 06/30/2020   AST 6 (A) 06/30/2020   ALKPHOS 67 05/17/2020   BILITOT 1.5 (H) 05/17/2020   No results found for: "25OHVITD2", "25OHVITD3", "VD25OH"   Review of Systems  Constitutional:  Negative for chills, fatigue, fever, malaise/fatigue and unexpected weight change.  HENT:  Negative for trouble swallowing.   Eyes:  Negative for blurred vision.  Respiratory:  Negative for cough, chest tightness, shortness of breath and wheezing.   Cardiovascular:  Negative for chest pain, palpitations, orthopnea, leg swelling and PND.  Gastrointestinal:  Negative for blood in stool, constipation and diarrhea.  Endocrine: Negative for polydipsia and polyuria.  Genitourinary:  Negative for difficulty urinating and hematuria.  Musculoskeletal:  Negative for myalgias and neck pain.  Neurological:  Negative for focal weakness and headaches.    Patient Active Problem List   Diagnosis Date Noted   Acute calculous cholecystitis 05/15/2020   Essential hypertension 06/29/2018   Adhesive capsulitis of left shoulder 06/26/2017   DDD (degenerative disc disease), lumbar 03/16/2014   Low back pain 03/16/2014   Lumbar radicular pain 03/16/2014   Superior glenoid labrum lesion 02/10/2014   Aftercare following surgery for neoplasm 01/20/2012   Basal cell carcinoma 12/09/2011   ED (erectile dysfunction) 10/08/2011  Dyslipidemia 08/06/2011   Diabetes mellitus without complication (HCC) 05/07/2011   Insomnia 05/07/2011    No Known Allergies  Past Surgical History:  Procedure Laterality Date   CHOLECYSTECTOMY  05/16/2020    Social History   Tobacco Use   Smoking status: Former   Smokeless tobacco: Never   Tobacco  comments:    wants info on quiting  Vaping Use   Vaping status: Every Day  Substance Use Topics   Alcohol use: Yes   Drug use: Never     Medication list has been reviewed and updated.  Current Meds  Medication Sig   atorvastatin (LIPITOR) 10 MG tablet Take 1 tablet (10 mg total) by mouth daily.   glimepiride (AMARYL) 2 MG tablet Take 1 tablet (2 mg total) by mouth daily before breakfast. (Patient taking differently: Take 2 mg by mouth daily before breakfast. solum)   JARDIANCE 25 MG TABS tablet Take 25 mg by mouth daily.   lisinopril (ZESTRIL) 5 MG tablet Take 1 tablet (5 mg total) by mouth daily.   metFORMIN (GLUCOPHAGE) 1000 MG tablet Take 1 tablet (1,000 mg total) by mouth 2 (two) times daily. (Patient taking differently: Take 1,000 mg by mouth 2 (two) times daily. solum)       02/04/2023    8:08 AM 08/06/2022    3:43 PM 03/13/2022   10:58 AM 01/21/2022    1:27 PM  GAD 7 : Generalized Anxiety Score  Nervous, Anxious, on Edge 0 0 0 1  Control/stop worrying 0 0 0 0  Worry too much - different things 0 0 0 0  Trouble relaxing 0 0 1 2  Restless 0 0 0 2  Easily annoyed or irritable 0 0 0 0  Afraid - awful might happen 0 0 0 0  Total GAD 7 Score 0 0 1 5  Anxiety Difficulty Not difficult at all Not difficult at all Not difficult at all Somewhat difficult       02/04/2023    8:08 AM 08/06/2022    3:43 PM 03/13/2022   10:57 AM  Depression screen PHQ 2/9  Decreased Interest 0 0 0  Down, Depressed, Hopeless 0 0 1  PHQ - 2 Score 0 0 1  Altered sleeping 0 0 0  Tired, decreased energy 1 0 1  Change in appetite 0 0 0  Feeling bad or failure about yourself  0 0 0  Trouble concentrating 0 0 0  Moving slowly or fidgety/restless 0 0 0  Suicidal thoughts 0 0 0  PHQ-9 Score 1 0 2  Difficult doing work/chores Not difficult at all Not difficult at all Not difficult at all    BP Readings from Last 3 Encounters:  02/04/23 128/78  08/06/22 124/78  06/24/22 132/86    Physical  Exam Vitals and nursing note reviewed.  HENT:     Head: Normocephalic.     Right Ear: Tympanic membrane and external ear normal.     Left Ear: Tympanic membrane and external ear normal.     Nose: Nose normal.     Mouth/Throat:     Pharynx: No oropharyngeal exudate or posterior oropharyngeal erythema.  Eyes:     General: No scleral icterus.       Right eye: No discharge.        Left eye: No discharge.     Conjunctiva/sclera: Conjunctivae normal.     Pupils: Pupils are equal, round, and reactive to light.  Neck:     Thyroid: No thyromegaly.  Vascular: No JVD.     Trachea: No tracheal deviation.  Cardiovascular:     Rate and Rhythm: Normal rate and regular rhythm.     Heart sounds: Normal heart sounds. No murmur heard.    No friction rub. No gallop.  Pulmonary:     Effort: No respiratory distress.     Breath sounds: Normal breath sounds. No wheezing or rales.  Abdominal:     General: Bowel sounds are normal.     Palpations: Abdomen is soft. There is no mass.     Tenderness: There is no abdominal tenderness. There is no guarding or rebound.  Musculoskeletal:        General: No tenderness. Normal range of motion.     Cervical back: Normal range of motion and neck supple.  Lymphadenopathy:     Cervical: No cervical adenopathy.  Skin:    General: Skin is warm.     Findings: No rash.  Neurological:     Mental Status: He is alert and oriented to person, place, and time.     Cranial Nerves: No cranial nerve deficit.     Deep Tendon Reflexes: Reflexes are normal and symmetric.     Wt Readings from Last 3 Encounters:  02/04/23 209 lb (94.8 kg)  08/06/22 213 lb (96.6 kg)  03/13/22 206 lb (93.4 kg)    BP 128/78   Pulse 88   Ht 6' (1.829 m)   Wt 209 lb (94.8 kg)   SpO2 99%   BMI 28.35 kg/m   Assessment and Plan: 1. Dyslipidemia Chronic.  Controlled.  Stable.  Patient without myalgias or focal motor weakness.  Tolerating current dosing of atorvastatin 10 mg and we will  refill this for 6 months.  Review of lipid panel that was done with endocrinology is acceptable and we will recheck patient in 6 months. - atorvastatin (LIPITOR) 10 MG tablet; Take 1 tablet (10 mg total) by mouth daily.  Dispense: 90 tablet; Refill: 1  2. Essential hypertension (Primary) Chronic.  Controlled.  Stable.  Blood pressure 128/78.  Tolerating medication well.  Currently is on lisinopril 5 mg once a day and we will continue current dosing.  Review of previous CMP is acceptable for electrolytes and GFR.  Will recheck patient in 6 months. - lisinopril (ZESTRIL) 5 MG tablet; Take 1 tablet (5 mg total) by mouth daily.  Dispense: 90 tablet; Refill: 1  3. Need for influenza vaccination Discussed and administered - Flu vaccine trivalent PF, 6mos and older(Flulaval,Afluria,Fluarix,Fluzone)  4. Need for COVID-19 vaccine Cussed and administered - [employer] Covid -19 Vaccine 25yrs and older     Elizabeth Sauer, MD

## 2023-02-04 NOTE — Patient Instructions (Signed)

## 2023-07-17 ENCOUNTER — Ambulatory Visit: Payer: Self-pay | Admitting: Family Medicine

## 2023-08-04 ENCOUNTER — Other Ambulatory Visit: Payer: Self-pay

## 2023-08-04 DIAGNOSIS — I10 Essential (primary) hypertension: Secondary | ICD-10-CM

## 2023-08-04 MED ORDER — LISINOPRIL 5 MG PO TABS: 5.0000 mg | ORAL_TABLET | Freq: Every day | ORAL | 0 refills | Status: AC

## 2023-09-08 ENCOUNTER — Telehealth: Payer: Self-pay

## 2023-09-08 NOTE — Telephone Encounter (Signed)
 Patient due for Blood pressure follow up. Needs to make transfer of care visit with new provider. Pls call to schedule.  CM

## 2023-09-09 ENCOUNTER — Telehealth: Payer: Self-pay

## 2023-09-09 NOTE — Telephone Encounter (Signed)
 Spoke with pt and patient and patient has moved and has new provider. fyi
